# Patient Record
Sex: Female | Born: 1985 | Race: White | Hispanic: Yes | Marital: Single | Smoking: Never smoker
Health system: Southern US, Community
[De-identification: ages and names within clinical notes are randomized; demographics above are authoritative.]

## PROBLEM LIST (undated history)

## (undated) DIAGNOSIS — M5136 Other intervertebral disc degeneration, lumbar region: Secondary | ICD-10-CM

## (undated) DIAGNOSIS — M51369 Other intervertebral disc degeneration, lumbar region without mention of lumbar back pain or lower extremity pain: Secondary | ICD-10-CM

## (undated) DIAGNOSIS — G479 Sleep disorder, unspecified: Secondary | ICD-10-CM

## (undated) DIAGNOSIS — R635 Abnormal weight gain: Secondary | ICD-10-CM

## (undated) HISTORY — PX: COLONOSCOPY: SHX174

## (undated) HISTORY — DX: Other intervertebral disc degeneration, lumbar region: M51.36

## (undated) HISTORY — DX: Other intervertebral disc degeneration, lumbar region without mention of lumbar back pain or lower extremity pain: M51.369

## (undated) HISTORY — DX: Sleep disorder, unspecified: G47.9

## (undated) HISTORY — DX: Abnormal weight gain: R63.5

---

## 2017-12-17 ENCOUNTER — Ambulatory Visit (INDEPENDENT_AMBULATORY_CARE_PROVIDER_SITE_OTHER): Payer: 59 | Admitting: Physician Assistant

## 2017-12-17 ENCOUNTER — Encounter: Payer: Self-pay | Admitting: Physician Assistant

## 2017-12-17 VITALS — BP 113/89 | HR 110 | Ht 67.0 in | Wt 236.0 lb

## 2017-12-17 DIAGNOSIS — L299 Pruritus, unspecified: Secondary | ICD-10-CM

## 2017-12-17 DIAGNOSIS — Z13220 Encounter for screening for lipoid disorders: Secondary | ICD-10-CM | POA: Diagnosis not present

## 2017-12-17 DIAGNOSIS — Z6837 Body mass index (BMI) 37.0-37.9, adult: Secondary | ICD-10-CM | POA: Diagnosis not present

## 2017-12-17 DIAGNOSIS — E6609 Other obesity due to excess calories: Secondary | ICD-10-CM

## 2017-12-17 DIAGNOSIS — Z131 Encounter for screening for diabetes mellitus: Secondary | ICD-10-CM

## 2017-12-17 DIAGNOSIS — Z6839 Body mass index (BMI) 39.0-39.9, adult: Secondary | ICD-10-CM | POA: Insufficient documentation

## 2017-12-17 DIAGNOSIS — H01113 Allergic dermatitis of right eye, unspecified eyelid: Secondary | ICD-10-CM | POA: Diagnosis not present

## 2017-12-17 DIAGNOSIS — L219 Seborrheic dermatitis, unspecified: Secondary | ICD-10-CM | POA: Diagnosis not present

## 2017-12-17 DIAGNOSIS — E66812 Obesity, class 2: Secondary | ICD-10-CM | POA: Insufficient documentation

## 2017-12-17 MED ORDER — KETOCONAZOLE 2 % EX SHAM: 1.0000 "application " | MEDICATED_SHAMPOO | CUTANEOUS | 1 refills | Status: DC

## 2017-12-17 MED ORDER — PHENTERMINE HCL 37.5 MG PO TABS: 37.5000 mg | ORAL_TABLET | Freq: Every day | ORAL | 0 refills | Status: DC

## 2017-12-17 MED ORDER — AZELASTINE HCL 0.05 % OP SOLN: 1.0000 [drp] | Freq: Two times a day (BID) | OPHTHALMIC | 1 refills | Status: DC

## 2017-12-17 NOTE — Patient Instructions (Signed)
Seborrheic Dermatitis, Adult Seborrheic dermatitis is a skin disease that causes red, scaly patches. It usually occurs on the scalp, and it is often called dandruff. The patches may appear on other parts of the body. Skin patches tend to appear where there are many oil glands in the skin. Areas of the body that are commonly affected include:  Scalp.  Skin folds of the body.  Ears.  Eyebrows.  Neck.  Face.  Armpits.  The bearded area of men's faces.  The condition may come and go for no known reason, and it is often long-lasting (chronic). What are the causes? The cause of this condition is not known. What increases the risk? This condition is more likely to develop in people who:  Have certain conditions, such as: ? HIV (human immunodeficiency virus). ? AIDS (acquired immunodeficiency syndrome). ? Parkinson disease. ? Mood disorders, such as depression.  Are 40-60 years old.  What are the signs or symptoms? Symptoms of this condition include:  Thick scales on the scalp.  Redness on the face or in the armpits.  Skin that is flaky. The flakes may be white or yellow.  Skin that seems oily or dry but is not helped with moisturizers.  Itching or burning in the affected areas.  How is this diagnosed? This condition is diagnosed with a medical history and physical exam. A sample of your skin may be tested (skin biopsy). You may need to see a skin specialist (dermatologist). How is this treated? There is no cure for this condition, but treatment can help to manage the symptoms. You may get treatment to remove scales, lower the risk of skin infection, and reduce swelling or itching. Treatment may include:  Creams that reduce swelling and irritation (steroids).  Creams that reduce skin yeast.  Medicated shampoo, soaps, moisturizing creams, or ointments.  Medicated moisturizing creams or ointments.  Follow these instructions at home:  Apply over-the-counter and  prescription medicines only as told by your health care provider.  Use any medicated shampoo, soaps, skin creams, or ointments only as told by your health care provider.  Keep all follow-up visits as told by your health care provider. This is important. Contact a health care provider if:  Your symptoms do not improve with treatment.  Your symptoms get worse.  You have new symptoms. This information is not intended to replace advice given to you by your health care provider. Make sure you discuss any questions you have with your health care provider. Document Released: 01/16/2005 Document Revised: 08/06/2015 Document Reviewed: 05/06/2015 Elsevier Interactive Patient Education  2018 Elsevier Inc.  

## 2017-12-17 NOTE — Progress Notes (Signed)
New Patient Office Visit  Subjective:  Patient ID: CHE BELOW, female    DOB: Dec 31, 1985  Age: 32 y.o. MRN: [id number]  CC:  Chief Complaint  Patient presents with  . Hair/Scalp Problem    HPI Courtney Bailey presents to establish care and to discuss some issues.   She would like to work on weight loss. Over the years she has tried numerous diet plans. At one point being a vegetarian and working out 6 times a week she lost 100lbs. 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 She has never tried medication. She does not count her calories.   She does have some itchy and scaly scalp for last few months. She almost feels like all her skin is dry. She has tried coconut oil without any relief.   Her sister has hypothyroidism and is concerning for her.   She also complains of bumps under her right eyelid. She notices some inflammation and "catching on contact". She went to eye doctor and was given some prednisone drops. That did help but symptoms can right back. She does use monthly contacts.   History reviewed. No pertinent past medical history.  History reviewed. No pertinent surgical history.  Family History  Problem Relation Age of Onset  . Hypothyroidism Sister     Social History   Socioeconomic History  . Marital status: Single    Spouse name: Not on file  . Number of children: Not on file  . Years of education: Not on file  . Highest education level: Not on file  Occupational History  . Not on file  Social Needs  . Financial resource strain: Not on file  . Food insecurity:    Worry: Not on file    Inability: Not on file  . Transportation needs:    Medical: Not on file    Non-medical: Not on file  Tobacco Use  . Smoking status: Never Smoker  . Smokeless tobacco: Never Used  Substance and Sexual Activity  . Alcohol use: Not Currently  . Drug use: Never  . Sexual activity: Not Currently  Lifestyle  . Physical  activity:    Days per week: Not on file    Minutes per session: Not on file  . Stress: Not on file  Relationships  . Social connections:    Talks on phone: Not on file    Gets together: Not on file    Attends religious service: Not on file    Active member of club or organization: Not on file    Attends meetings of clubs or organizations: Not on file    Relationship status: Not on file  . Intimate partner violence:    Fear of current or ex partner: Not on file    Emotionally abused: Not on file    Physically abused: Not on file    Forced sexual activity: Not on file  Other Topics Concern  . Not on file  Social History Narrative  . Not on file    ROS Review of Systems  Objective:   Today's Vitals: BP 113/89   Pulse (!) 110   Ht 5\' 7"  (1.702 m)   Wt 236 lb (107 kg)   BMI 36.96 kg/m   Physical Exam  Constitutional: She is oriented to person, place, and time. She appears well-developed and well-nourished.  HENT:  Head: Normocephalic and atraumatic.  Scaly scalp around the hairline.   Eyes:  Conjunctivae are normal. Right eye exhibits no discharge. Left eye exhibits no discharge.  Under right eyelid very erythematous with small flesh colored papules.   Neck: Normal range of motion. Neck supple. No thyromegaly present.  Cardiovascular: Normal rate and regular rhythm.  Pulmonary/Chest: Effort normal and breath sounds normal.  Neurological: She is alert and oriented to person, place, and time.  Psychiatric: She has a normal mood and affect. Her behavior is normal.    Assessment & Plan:   Problem List Items Addressed This Visit      Unprioritized   Class 2 obesity due to excess calories without serious comorbidity with body mass index (BMI) of 37.0 to 37.9 in adult - Primary   Relevant Medications   phentermine (ADIPEX-P) 37.5 MG tablet   Other Relevant Orders   TSH   CBC with Differential/Platelet   Seborrheic dermatitis of scalp   Itchy scalp   Eyelid dermatitis,  allergic/contact, right    Other Visit Diagnoses    Screening for lipid disorders       Relevant Orders   Lipid Panel w/reflex Direct LDL   Screening for diabetes mellitus       Relevant Orders   COMPLETE METABOLIC PANEL WITH GFR      Outpatient Encounter Medications as of 12/17/2017  Medication Sig  . azelastine (OPTIVAR) 0.05 % ophthalmic solution Place 1 drop into both eyes 2 (two) times daily.  Marland Kitchen. ketoconazole (NIZORAL) 2 % shampoo Apply 1 application topically 2 (two) times a week.  . phentermine (ADIPEX-P) 37.5 MG tablet Take 1 tablet (37.5 mg total) by mouth daily before breakfast.   No facility-administered encounter medications on file as of 12/17/2017.    Marland Kitchen..Discussed low carb diet with 1500 calories and 80g of protein.  Exercising at least 150 minutes a week.  My Fitness Pal could be a [employer] resource.  Discussed medication options. Decided to start phentermine. Discussed side effects. Nurse visit 1 month.   Scalp appears like seb dermatitis. Given HO with OTC options. Ketoconazole sent to use twice weekly.   Will get some screening labs.   Discussed need for pap.   Follow-up: Return in about 4 weeks (around 01/14/2018) for nurse weight check.   Tandy GawJade Marilla Boddy, PA-C

## 2017-12-18 ENCOUNTER — Encounter: Payer: Self-pay | Admitting: Physician Assistant

## 2017-12-18 DIAGNOSIS — R7301 Impaired fasting glucose: Secondary | ICD-10-CM | POA: Insufficient documentation

## 2017-12-18 DIAGNOSIS — E785 Hyperlipidemia, unspecified: Secondary | ICD-10-CM | POA: Insufficient documentation

## 2017-12-18 NOTE — Progress Notes (Signed)
Call Pt: thyroid in normal range. Fasting glucose is a little elevated. Need to add A!C to further investigate glucose. Kidney, liver look great. No anemia.  LDL is elevated. HDL is low. Exercise and diet changes could help. No need for medication at this time(due to no current risk factors) but will need to monitor annually.

## 2017-12-21 ENCOUNTER — Encounter: Payer: Self-pay | Admitting: Physician Assistant

## 2017-12-21 DIAGNOSIS — R7303 Prediabetes: Secondary | ICD-10-CM | POA: Insufficient documentation

## 2017-12-21 LAB — COMPLETE METABOLIC PANEL WITH GFR
AG Ratio: 1.5 (calc) (ref 1.0–2.5)
ALBUMIN MSPROF: 4.1 g/dL (ref 3.6–5.1)
ALT: 13 U/L (ref 6–29)
AST: 13 U/L (ref 10–30)
Alkaline phosphatase (APISO): 63 U/L (ref 33–115)
BUN: 8 mg/dL (ref 7–25)
CO2: 26 mmol/L (ref 20–32)
CREATININE: 0.76 mg/dL (ref 0.50–1.10)
Calcium: 9.5 mg/dL (ref 8.6–10.2)
Chloride: 105 mmol/L (ref 98–110)
GFR, Est African American: 122 mL/min/{1.73_m2} (ref >=60)
GFR, Est Non African American: 105 mL/min/{1.73_m2} (ref >=60)
GLUCOSE: 113 mg/dL — AB (ref 65–99)
Globulin: 2.8 g/dL (calc) (ref 1.9–3.7)
Potassium: 4.2 mmol/L (ref 3.5–5.3)
Sodium: 138 mmol/L (ref 135–146)
TOTAL PROTEIN: 6.9 g/dL (ref 6.1–8.1)
Total Bilirubin: 0.2 mg/dL (ref 0.2–1.2)

## 2017-12-21 LAB — CBC WITH DIFFERENTIAL/PLATELET
BASOS ABS: 38 {cells}/uL (ref 0–200)
Basophils Relative: 0.7 %
EOS ABS: 211 {cells}/uL (ref 15–500)
Eosinophils Relative: 3.9 %
HEMATOCRIT: 37.7 % (ref 35.0–45.0)
Hemoglobin: 12.4 g/dL (ref 11.7–15.5)
LYMPHS ABS: 1760 {cells}/uL (ref 850–3900)
MCH: 28.5 pg (ref 27.0–33.0)
MCHC: 32.9 g/dL (ref 32.0–36.0)
MCV: 86.7 fL (ref 80.0–100.0)
MPV: 10.6 fL (ref 7.5–12.5)
Monocytes Relative: 7.2 %
NEUTROS PCT: 55.6 %
Neutro Abs: 3002 cells/uL (ref 1500–7800)
Platelets: 318 10*3/uL (ref 140–400)
RBC: 4.35 10*6/uL (ref 3.80–5.10)
RDW: 13.4 % (ref 11.0–15.0)
Total Lymphocyte: 32.6 %
WBC: 5.4 10*3/uL (ref 3.8–10.8)
WBCMIX: 389 {cells}/uL (ref 200–950)

## 2017-12-21 LAB — LIPID PANEL W/REFLEX DIRECT LDL
CHOLESTEROL: 216 mg/dL — AB (ref <200)
HDL: 48 mg/dL — ABNORMAL LOW (ref >50)
LDL Cholesterol (Calc): 148 mg/dL (calc) — ABNORMAL HIGH
Non-HDL Cholesterol (Calc): 168 mg/dL (calc) — ABNORMAL HIGH (ref <130)
TRIGLYCERIDES: 93 mg/dL (ref <150)
Total CHOL/HDL Ratio: 4.5 (calc) (ref <5.0)

## 2017-12-21 LAB — HEMOGLOBIN A1C W/OUT EAG: HEMOGLOBIN A1C: 5.7 %{Hb} — AB (ref <5.7)

## 2017-12-21 LAB — TSH: TSH: 2.47 m[IU]/L

## 2017-12-21 LAB — TEST AUTHORIZATION

## 2017-12-21 NOTE — Progress Notes (Signed)
Call pt: you are in pre-diabetes. Start now watching sugar/carbs/weight. Will continue to recheck every 6 month.

## 2017-12-31 ENCOUNTER — Encounter: Payer: Self-pay | Admitting: Physician Assistant

## 2018-01-04 ENCOUNTER — Ambulatory Visit (INDEPENDENT_AMBULATORY_CARE_PROVIDER_SITE_OTHER): Payer: 59 | Admitting: Physician Assistant

## 2018-01-04 ENCOUNTER — Encounter: Payer: Self-pay | Admitting: Physician Assistant

## 2018-01-04 VITALS — BP 119/83 | HR 104 | Ht 67.0 in | Wt 234.0 lb

## 2018-01-04 DIAGNOSIS — H9190 Unspecified hearing loss, unspecified ear: Secondary | ICD-10-CM | POA: Insufficient documentation

## 2018-01-04 DIAGNOSIS — H6983 Other specified disorders of Eustachian tube, bilateral: Secondary | ICD-10-CM | POA: Diagnosis not present

## 2018-01-04 MED ORDER — METHYLPREDNISOLONE 4 MG PO TBPK: ORAL_TABLET | ORAL | 0 refills | Status: DC

## 2018-01-04 NOTE — Progress Notes (Signed)
   Subjective:    Patient ID: Courtney Bailey, female    DOB: 01/27/1986, 32 y.o.   MRN: [id number]  HPI Patient is a 32 year old female presenting to clinic with a complaint of muffled hearing. She thought that she might have a cerumen impaction and wanted to be seen and have ears irrigated if necessary. Right ear is worse than left. She denies pain, pressure, popping, sinus congestion, fever, and malaise.   .. Active Ambulatory Problems    Diagnosis Date Noted  . Class 2 obesity due to excess calories without serious comorbidity with body mass index (BMI) of 37.0 to 37.9 in adult 12/17/2017  . Seborrheic dermatitis of scalp 12/17/2017  . Itchy scalp 12/17/2017  . Eyelid dermatitis, allergic/contact, right 12/17/2017  . Elevated fasting glucose 12/18/2017  . Dyslipidemia (high LDL; low HDL) 12/18/2017  . Prediabetes 12/21/2017  . Perceived hearing changes 01/04/2018  . ETD (Eustachian tube dysfunction), bilateral 01/04/2018   Resolved Ambulatory Problems    Diagnosis Date Noted  . No Resolved Ambulatory Problems   No Additional Past Medical History      Review of Systems  Constitutional: Negative for chills, fatigue and fever.  HENT: Positive for hearing loss. Negative for congestion, ear discharge, ear pain, postnasal drip, rhinorrhea, sinus pressure, sinus pain, sneezing and sore throat.   Respiratory: Negative for cough, shortness of breath and wheezing.        Objective:   Physical Exam  Constitutional: She appears well-developed and well-nourished. No distress.  HENT:  Right Ear: External ear and ear canal normal. A middle ear effusion is present.  Left Ear: External ear and ear canal normal. A middle ear effusion is present.  Mouth/Throat: Oropharynx is clear and moist.  No cerumen noted bilaterally. Effusion present bilaterally, but is worse in right ear.          Assessment & Plan:  .Marland Kitchen.Courtney Bailey was seen today for hearing loss.  Diagnoses and all orders for  this visit:  ETD (Eustachian tube dysfunction), bilateral -     methylPREDNISolone (MEDROL DOSEPAK) 4 MG TBPK tablet; Take as directed by package insert.  Perceived hearing changes    Muffled hearing: No cerumen noted bilaterally. No infection.  Effusion present in both ears. Assured patient that muffled hearing most likely due to effusion. Encouraged her to use Flonase to decrease congestion. She was instructed to use this first and if no relief then can progress to prescribed Medrol.  Marland Kitchen.Harlon Flor.I, Foxx Klarich PA-C, have reviewed and agree with the above documentation in it's entirety.

## 2018-01-04 NOTE — Patient Instructions (Signed)
Eustachian Tube Dysfunction The eustachian tube connects the middle ear to the back of the nose. It regulates air pressure in the middle ear by allowing air to move between the ear and nose. It also helps to drain fluid from the middle ear space. When the eustachian tube does not function properly, air pressure, fluid, or both can build up in the middle ear. Eustachian tube dysfunction can affect one or both ears. What are the causes? This condition happens when the eustachian tube becomes blocked or cannot open normally. This may result from:  Ear infections.  Colds and other upper respiratory infections.  Allergies.  Irritation, such as from cigarette smoke or acid from the stomach coming up into the esophagus (gastroesophageal reflux).  Sudden changes in air pressure, such as from descending in an airplane.  Abnormal growths in the nose or throat, such as nasal polyps, tumors, or enlarged tissue at the back of the throat (adenoids).  What increases the risk? This condition may be more likely to develop in people who smoke and people who are overweight. Eustachian tube dysfunction may also be more likely to develop in children, especially children who have:  Certain birth defects of the mouth, such as cleft palate.  Large tonsils and adenoids.  What are the signs or symptoms? Symptoms of this condition may include:  A feeling of fullness in the ear.  Ear pain.  Clicking or popping noises in the ear.  Ringing in the ear.  Hearing loss.  Loss of balance.  Symptoms may get worse when the air pressure around you changes, such as when you travel to an area of high elevation or fly on an airplane. How is this diagnosed? This condition may be diagnosed based on:  Your symptoms.  A physical exam of your ear, nose, and throat.  Tests, such as those that measure: ? The movement of your eardrum (tympanogram). ? Your hearing (audiometry).  How is this treated? Treatment  depends on the cause and severity of your condition. If your symptoms are mild, you may be able to relieve your symptoms by moving air into ("popping") your ears. If you have symptoms of fluid in your ears, treatment may include:  Decongestants.  Antihistamines.  Nasal sprays or ear drops that contain medicines that reduce swelling (steroids).  In some cases, you may need to have a procedure to drain the fluid in your eardrum (myringotomy). In this procedure, a small tube is placed in the eardrum to:  Drain the fluid.  Restore the air in the middle ear space.  Follow these instructions at home:  Take over-the-counter and prescription medicines only as told by your health care provider.  Use techniques to help pop your ears as recommended by your health care provider. These may include: ? Chewing gum. ? Yawning. ? Frequent, forceful swallowing. ? Closing your mouth, holding your nose closed, and gently blowing as if you are trying to blow air out of your nose.  Do not do any of the following until your health care provider approves: ? Travel to high altitudes. ? Fly in airplanes. ? Work in a pressurized cabin or room. ? Scuba dive.  Keep your ears dry. Dry your ears completely after showering or bathing.  Do not smoke.  Keep all follow-up visits as told by your health care provider. This is important. Contact a health care provider if:  Your symptoms do not go away after treatment.  Your symptoms come back after treatment.  You are   unable to pop your ears.  You have: ? A fever. ? Pain in your ear. ? Pain in your head or neck. ? Fluid draining from your ear.  Your hearing suddenly changes.  You become very dizzy.  You lose your balance. This information is not intended to replace advice given to you by your health care provider. Make sure you discuss any questions you have with your health care provider. Document Released: 02/12/2015 Document Revised: 06/24/2015  Document Reviewed: 02/04/2014 Elsevier Interactive Patient Education  2018 Elsevier Inc.  

## 2018-01-31 ENCOUNTER — Encounter: Payer: 59 | Admitting: Obstetrics & Gynecology

## 2018-02-13 ENCOUNTER — Encounter: Payer: Self-pay | Admitting: Physician Assistant

## 2018-02-13 ENCOUNTER — Ambulatory Visit (INDEPENDENT_AMBULATORY_CARE_PROVIDER_SITE_OTHER): Payer: 59 | Admitting: Physician Assistant

## 2018-02-13 VITALS — BP 132/85 | HR 84 | Ht 67.0 in | Wt 240.0 lb

## 2018-02-13 DIAGNOSIS — F339 Major depressive disorder, recurrent, unspecified: Secondary | ICD-10-CM

## 2018-02-13 DIAGNOSIS — F419 Anxiety disorder, unspecified: Secondary | ICD-10-CM

## 2018-02-13 DIAGNOSIS — F5101 Primary insomnia: Secondary | ICD-10-CM

## 2018-02-13 MED ORDER — ZOLPIDEM TARTRATE 5 MG PO TABS: 5.0000 mg | ORAL_TABLET | Freq: Every evening | ORAL | 5 refills | Status: DC | PRN

## 2018-02-13 NOTE — Progress Notes (Signed)
Subjective:    Patient ID: Courtney Bailey, female    DOB: [birth year], 33 y.o.   MRN: [id number]  HPI  Pt is a 33 yo female who presents to the clinic to follow up on weight loss and phentermine.   She did not like phentermine. She has a hx of anxiety, depression and insomnia and it made it worse. She has stopped it.   She feels like her sleep is getting worse and worse. She has not been able to sleep well since early 20's. She has tried OTC meds and trazodone caused nightmares. She once tried [place] and it worked but never had one again. She goes to bed around 11 but wakes up 2 and then up and down.   She does feel like her mood is worse too but unsure if it is because of not sleeping. She is more tearful and anxious. No SI/HC.  Marland Kitchen. Active Ambulatory Problems    Diagnosis Date Noted  . Class 2 obesity due to excess calories without serious comorbidity with body mass index (BMI) of 37.0 to 37.9 in adult 12/17/2017  . Seborrheic dermatitis of scalp 12/17/2017  . Itchy scalp 12/17/2017  . Eyelid dermatitis, allergic/contact, right 12/17/2017  . Elevated fasting glucose 12/18/2017  . Dyslipidemia (high LDL; low HDL) 12/18/2017  . Prediabetes 12/21/2017  . Perceived hearing changes 01/04/2018  . ETD (Eustachian tube dysfunction), bilateral 01/04/2018  . Depression, recurrent (HCC) 02/19/2018  . Anxiety 02/19/2018  . Primary insomnia 02/19/2018   Resolved Ambulatory Problems    Diagnosis Date Noted  . No Resolved Ambulatory Problems   No Additional Past Medical History    Review of Systems  All other systems reviewed and are negative.      Objective:   Physical Exam Vitals signs reviewed.  Constitutional:      Appearance: Normal appearance. She is obese.  HENT:     Head: Normocephalic and atraumatic.  Cardiovascular:     Rate and Rhythm: Normal rate and regular rhythm.     Pulses: Normal pulses.     Heart sounds: Normal heart sounds.  Neurological:     General: No  focal deficit present.     Mental Status: She is alert and oriented to person, place, and time.  Psychiatric:     Comments: Tearful.            Assessment & Plan:  .Marland KitchenCally was seen today for medication management.  Diagnoses and all orders for this visit:  Primary insomnia -     zolpidem (AMBIEN) 5 MG tablet; Take 1 tablet (5 mg total) by mouth at bedtime as needed for sleep.  Anxiety  Depression, recurrent (HCC)   .Marland Kitchen Depression screen PHQ 2/9 12/17/2017  Decreased Interest 0  Down, Depressed, Hopeless 0  PHQ - 2 Score 0  Altered sleeping 1  Tired, decreased energy 0  Change in appetite 0  Feeling bad or failure about yourself  0  Trouble concentrating 1  Moving slowly or fidgety/restless 0  Suicidal thoughts 0  PHQ-9 Score 2  Difficult doing work/chores Not difficult at all   .Marland Kitchen GAD 7 : Generalized Anxiety Score 12/17/2017  Nervous, Anxious, on Edge 1  Control/stop worrying 0  Worry too much - different things 0  Trouble relaxing 1  Restless 0  Easily annoyed or irritable 0  Afraid - awful might happen 0  Total GAD 7 Score 2  Anxiety Difficulty Not difficult at all   MDQ- 1 question away from  positive bipolar screen.   Back in November anxiety and depression screening scores were pretty good. I am wondering if adding phentermine causes some temporary worsening. She has stopped now. I would like to ger her sleeping first and see how she is doing before we add any more medications. ambien sent. She has tolerate well in the past. Follow up in 71month.

## 2018-02-19 DIAGNOSIS — F419 Anxiety disorder, unspecified: Secondary | ICD-10-CM | POA: Insufficient documentation

## 2018-02-19 DIAGNOSIS — F5101 Primary insomnia: Secondary | ICD-10-CM | POA: Insufficient documentation

## 2018-02-19 DIAGNOSIS — F339 Major depressive disorder, recurrent, unspecified: Secondary | ICD-10-CM | POA: Insufficient documentation

## 2018-02-19 DIAGNOSIS — G4700 Insomnia, unspecified: Secondary | ICD-10-CM | POA: Insufficient documentation

## 2018-03-06 ENCOUNTER — Other Ambulatory Visit: Payer: Self-pay

## 2018-03-06 MED ORDER — KETOCONAZOLE 2 % EX SHAM: 1.0000 "application " | MEDICATED_SHAMPOO | CUTANEOUS | 1 refills | Status: DC

## 2018-03-18 ENCOUNTER — Ambulatory Visit (INDEPENDENT_AMBULATORY_CARE_PROVIDER_SITE_OTHER): Payer: 59 | Admitting: Obstetrics & Gynecology

## 2018-03-18 ENCOUNTER — Encounter: Payer: Self-pay | Admitting: Obstetrics & Gynecology

## 2018-03-18 VITALS — BP 124/88 | HR 76 | Ht 67.0 in | Wt 250.0 lb

## 2018-03-18 DIAGNOSIS — N939 Abnormal uterine and vaginal bleeding, unspecified: Secondary | ICD-10-CM

## 2018-03-18 DIAGNOSIS — N926 Irregular menstruation, unspecified: Secondary | ICD-10-CM

## 2018-03-18 DIAGNOSIS — Z3169 Encounter for other general counseling and advice on procreation: Secondary | ICD-10-CM | POA: Diagnosis not present

## 2018-03-18 NOTE — Progress Notes (Signed)
Pt wants to discuss becoming pregnant and irregular periods

## 2018-03-18 NOTE — Progress Notes (Signed)
   Subjective:    Patient ID: Courtney Bailey, female    DOB: 01-29-1986, 33 y.o.   MRN: [id number]  HPI Patient is a 33 year old nulliparous female who presents for evaluation of menstrual cycle.  Her menstrual cycle never came twice in a month.  Now her cycles are every 30 days in the past 2 months she has had 2 cycles.  See below for details.  Menarche age 32 Regular from the start, never have been on birth control December--5 days (during the first week) December 31st bled for 7 days January 30-Feb 3--nml menses  Patient would like to become pregnant.  She is not taking prenatal vitamins but will start.  She is not had cystic fibrosis or SMA testing.  This was offered today.  Patient also asked about optimal weight before conceiving.  We discussed the risk of hypertension diabetes preeclampsia C-section with morbid obesity (BMI = 39) in pregnancy.  Patient is already prediabetic and would most likely flip into gestational diabetes.  Review of Systems  Constitutional: Negative.   Respiratory: Negative.   Cardiovascular: Negative.   Gastrointestinal: Negative.   Genitourinary: Negative.        Objective:   Physical Exam Vitals signs reviewed.  Constitutional:      General: She is not in acute distress.    Appearance: She is well-developed.  HENT:     Head: Normocephalic and atraumatic.  Eyes:     Conjunctiva/sclera: Conjunctivae normal.  Cardiovascular:     Rate and Rhythm: Normal rate.  Pulmonary:     Effort: Pulmonary effort is normal.  Skin:    General: Skin is warm and dry.  Neurological:     Mental Status: She is alert and oriented to person, place, and time.    Assessment & Plan:  33 year old female with change in menstrual cycle but still within normal limits.  Cycles are 30 days.  1.  Prenatal vitamins 2.  CF F and SMA testing suggested 3.  Starting pregnancy at normal weight encouraged.  Discussed weight watchers and my fitness pal as well as exercise. 4.   Patient needs a Pap smear.  It is probably been 3 years.  She will schedule an annual exam.  30 minutes spent face-to-face with patient with greater than 50% counseling.

## 2018-03-27 ENCOUNTER — Other Ambulatory Visit: Payer: Self-pay | Admitting: Physician Assistant

## 2018-03-27 DIAGNOSIS — H01113 Allergic dermatitis of right eye, unspecified eyelid: Secondary | ICD-10-CM

## 2018-03-27 NOTE — Progress Notes (Signed)
Pt continues to have problem with her right dry, itchy, eye. Made referral to eye doctor.

## 2018-04-09 DIAGNOSIS — H10413 Chronic giant papillary conjunctivitis, bilateral: Secondary | ICD-10-CM | POA: Diagnosis not present

## 2018-04-22 ENCOUNTER — Ambulatory Visit: Payer: 59 | Admitting: Obstetrics & Gynecology

## 2018-04-30 ENCOUNTER — Encounter

## 2018-04-30 ENCOUNTER — Other Ambulatory Visit: Payer: Self-pay

## 2018-04-30 ENCOUNTER — Ambulatory Visit (INDEPENDENT_AMBULATORY_CARE_PROVIDER_SITE_OTHER): Payer: 59 | Admitting: Physician Assistant

## 2018-04-30 ENCOUNTER — Encounter: Payer: Self-pay | Admitting: Physician Assistant

## 2018-04-30 VITALS — BP 138/81 | HR 72 | Temp 97.7°F | Wt 247.0 lb

## 2018-04-30 DIAGNOSIS — J302 Other seasonal allergic rhinitis: Secondary | ICD-10-CM

## 2018-04-30 DIAGNOSIS — H1013 Acute atopic conjunctivitis, bilateral: Secondary | ICD-10-CM | POA: Diagnosis not present

## 2018-04-30 DIAGNOSIS — R52 Pain, unspecified: Secondary | ICD-10-CM

## 2018-04-30 DIAGNOSIS — J301 Allergic rhinitis due to pollen: Secondary | ICD-10-CM | POA: Diagnosis not present

## 2018-04-30 NOTE — Progress Notes (Signed)
Subjective:    Patient ID: Courtney Bailey, female    DOB: 1986/01/19, 33 y.o.   MRN: [id number]  HPI Pt is a 33 yo female who presents to the clinic to get cleared to go back to work. She came into work yesterday and reported body aches, sneezing, coughing, and feeling hot on her morning assessment due to COVID pandemic. She was sent home from work. Her temperature was 98.1 yesterday and this morning 97.7. She has hx of allergies and suspects allergies that were causing symptoms. She also started her period in which her body aches stopped. She is taking zyrtec. She denies any SOB. No travel. No sick contacts. She has no high risk exposure at work. She is ready to go back to work.     .. Active Ambulatory Problems    Diagnosis Date Noted  . Class 2 obesity due to excess calories without serious comorbidity with body mass index (BMI) of 37.0 to 37.9 in adult 12/17/2017  . Seborrheic dermatitis of scalp 12/17/2017  . Itchy scalp 12/17/2017  . Eyelid dermatitis, allergic/contact, right 12/17/2017  . Elevated fasting glucose 12/18/2017  . Dyslipidemia (high LDL; low HDL) 12/18/2017  . Prediabetes 12/21/2017  . Perceived hearing changes 01/04/2018  . ETD (Eustachian tube dysfunction), bilateral 01/04/2018  . Depression, recurrent (HCC) 02/19/2018  . Anxiety 02/19/2018  . Primary insomnia 02/19/2018  . Seasonal allergies 04/30/2018  . Seasonal allergic rhinitis due to pollen 04/30/2018  . Allergic conjunctivitis of both eyes 04/30/2018   Resolved Ambulatory Problems    Diagnosis Date Noted  . No Resolved Ambulatory Problems   Past Medical History:  Diagnosis Date  . Disc degeneration, lumbar   . Sleep difficulties   . Weight gain       Review of Systems   see HPI.  Objective:   Physical Exam Vitals signs reviewed.  Constitutional:      Appearance: Normal appearance.  HENT:     Head: Normocephalic.     Right Ear: Tympanic membrane and ear canal normal.     Left Ear:  Tympanic membrane and ear canal normal.     Nose: Rhinorrhea present. No congestion.     Comments: Clear nasal drainage.     Mouth/Throat:     Pharynx: Oropharynx is clear. No oropharyngeal exudate or posterior oropharyngeal erythema.  Eyes:     General:        Right eye: No discharge.        Left eye: No discharge.     Conjunctiva/sclera: Conjunctivae normal.  Neck:     Musculoskeletal: Normal range of motion.  Cardiovascular:     Rate and Rhythm: Normal rate and regular rhythm.     Pulses: Normal pulses.  Pulmonary:     Effort: Pulmonary effort is normal.     Breath sounds: Normal breath sounds. No wheezing or rhonchi.  Lymphadenopathy:     Cervical: No cervical adenopathy.  Skin:    General: Skin is warm.  Neurological:     General: No focal deficit present.     Mental Status: She is alert and oriented to person, place, and time.  Psychiatric:        Mood and Affect: Mood normal.        Behavior: Behavior normal.           Assessment & Plan:  .Marland KitchenKaysey was seen today for dysmenorrhea.  Diagnoses and all orders for this visit:  Seasonal allergic rhinitis due to pollen  Allergic conjunctivitis  of both eyes  Seasonal allergies  Body aches   Pt does not look ill appearing or toxic today. She has hx of allergies and admits to running outside on Sunday which typically causes her allergies to be worse. She is on zyrtec. She also started her period yesterday in which body aches stopped. She has no fever or SOB. Her cough is more of a throat clearing. Vitals look great today. She has no high risk patient exposure in office. I believe she should be release to go back to work immediately.   She needs to have her final clearance by nurse with [facility] team due to the flagged risk on morning assessment. She is aware.

## 2018-05-14 ENCOUNTER — Encounter: Payer: Self-pay | Admitting: Physician Assistant

## 2018-05-14 ENCOUNTER — Telehealth (INDEPENDENT_AMBULATORY_CARE_PROVIDER_SITE_OTHER): Payer: 59 | Admitting: Physician Assistant

## 2018-05-14 VITALS — BP 125/73 | HR 79 | Temp 97.5°F | Ht 67.0 in | Wt 240.0 lb

## 2018-05-14 DIAGNOSIS — N923 Ovulation bleeding: Secondary | ICD-10-CM | POA: Insufficient documentation

## 2018-05-14 NOTE — Progress Notes (Signed)
Patient ID: Courtney Bailey, female   DOB: [birth year], 33 y.o.   MRN: [id number]  .Marland KitchenVirtual Visit via Video Note  I connected with Courtney Bailey on 05/14/18 at  2:00 PM EDT by a video enabled telemedicine application and verified that I am speaking with the correct person using two identifiers.   I discussed the limitations of evaluation and management by telemedicine and the availability of in person appointments. The patient expressed understanding and agreed to proceed.  History of Present Illness: Pt is a 33 yo female who c/o spotting this morning. She is not on birth control. She denies any urinary symptoms, abdominal pain and hx of spotting. She is under a lot of stress. Her aunt lives in [address] with COVID pandemic going on and has a brain tumor newly dx. She wiped and noticed 2 clots of blood. She had sex a few days ago but not rough. Denies any vaginal discharge, itching or irritation.   .. Active Ambulatory Problems    Diagnosis Date Noted  . Class 2 obesity due to excess calories without serious comorbidity with body mass index (BMI) of 37.0 to 37.9 in adult 12/17/2017  . Seborrheic dermatitis of scalp 12/17/2017  . Itchy scalp 12/17/2017  . Eyelid dermatitis, allergic/contact, right 12/17/2017  . Elevated fasting glucose 12/18/2017  . Dyslipidemia (high LDL; low HDL) 12/18/2017  . Prediabetes 12/21/2017  . Perceived hearing changes 01/04/2018  . ETD (Eustachian tube dysfunction), bilateral 01/04/2018  . Depression, recurrent (HCC) 02/19/2018  . Anxiety 02/19/2018  . Primary insomnia 02/19/2018  . Seasonal allergies 04/30/2018  . Seasonal allergic rhinitis due to pollen 04/30/2018  . Allergic conjunctivitis of both eyes 04/30/2018   Resolved Ambulatory Problems    Diagnosis Date Noted  . No Resolved Ambulatory Problems   Past Medical History:  Diagnosis Date  . Disc degeneration, lumbar   . Sleep difficulties   . Weight gain    Reviewed med, allergy and problem  list.    Observations/Objective: No acute stress.  Normal heart rate.   .. Today's Vitals   05/14/18 1348  BP: 125/73  Pulse: 79  Temp: (!) 97.5 F (36.4 C)  TempSrc: Oral  SpO2: 99%  Weight: 240 lb (108.9 kg)  Height: 5\' 7"  (1.702 m)   Body mass index is 37.59 kg/m.   Assessment and Plan: Marland KitchenMarland KitchenDiagnoses and all orders for this visit:  Spotting between menses   UPT in office negative.   Discussed causes of menstrual spotting abrasion vs stress vs thyroid vs pregnancy. Since first day and just a few spots will continue to monitor. If her period does not come in 2 weeks repeat UPT. She is not preventing pregnancy but not trying either. Reassurance and close monitoring. If continues to have bleeding will do a pelvic exam as well.   Follow Up Instructions:    I discussed the assessment and treatment plan with the patient. The patient was provided an opportunity to ask questions and all were answered. The patient agreed with the plan and demonstrated an understanding of the instructions.   The patient was advised to call back or seek an in-person evaluation if the symptoms worsen or if the condition fails to improve as anticipated.  I provided 10 minutes of non-face-to-face time during this encounter.   Tandy Gaw, PA-C

## 2018-05-31 ENCOUNTER — Other Ambulatory Visit: Payer: Self-pay

## 2018-05-31 MED ORDER — KETOCONAZOLE 2 % EX SHAM: 1.0000 "application " | MEDICATED_SHAMPOO | CUTANEOUS | 1 refills | Status: DC

## 2018-07-15 ENCOUNTER — Encounter: Payer: Self-pay | Admitting: Physician Assistant

## 2018-07-15 ENCOUNTER — Telehealth (INDEPENDENT_AMBULATORY_CARE_PROVIDER_SITE_OTHER): Payer: 59 | Admitting: Physician Assistant

## 2018-07-15 VITALS — Temp 98.4°F | Ht 67.0 in | Wt 249.3 lb

## 2018-07-15 DIAGNOSIS — E6609 Other obesity due to excess calories: Secondary | ICD-10-CM

## 2018-07-15 DIAGNOSIS — Z6839 Body mass index (BMI) 39.0-39.9, adult: Secondary | ICD-10-CM | POA: Diagnosis not present

## 2018-07-15 MED ORDER — TOPIRAMATE 50 MG PO TABS: ORAL_TABLET | ORAL | 2 refills | Status: DC

## 2018-07-15 MED ORDER — PHENTERMINE HCL 37.5 MG PO TBDP: 1.0000 | ORAL_TABLET | Freq: Every day | ORAL | 0 refills | Status: DC

## 2018-07-15 NOTE — Progress Notes (Deleted)
She wants to discuss weight loss medications. She had palpitations with phentermine in past, but wondering if she can go back to half dose or try alternative medication.

## 2018-07-15 NOTE — Progress Notes (Signed)
Patient ID: Courtney Bailey, female   DOB: 1985/11/22, 33 y.o.   MRN: [id number] .Marland KitchenVirtual Visit via Video Note  I connected with Courtney Bailey on 07/15/18 at  9:50 AM EDT by a video enabled telemedicine application and verified that I am speaking with the correct person using two identifiers.  Location: Patient: home Provider: clinic   I discussed the limitations of evaluation and management by telemedicine and the availability of in person appointments. The patient expressed understanding and agreed to proceed.  History of Present Illness: Pt is a 33 yo obese female who calls into the clinic to discuss weight gain. She continues to gain weight. She would like to start trying for pregnancy in the near future but would like to get to a more healthy weight. She tried phentermine for a week once before but stopped due to palpitations and insomnia. She would like to try again at a lower dose. She is trying to watch calories. She is not currently exercising.   .. Active Ambulatory Problems    Diagnosis Date Noted  . Class 2 obesity due to excess calories without serious comorbidity with body mass index (BMI) of 39.0 to 39.9 in adult 12/17/2017  . Seborrheic dermatitis of scalp 12/17/2017  . Itchy scalp 12/17/2017  . Eyelid dermatitis, allergic/contact, right 12/17/2017  . Elevated fasting glucose 12/18/2017  . Dyslipidemia (high LDL; low HDL) 12/18/2017  . Prediabetes 12/21/2017  . Perceived hearing changes 01/04/2018  . ETD (Eustachian tube dysfunction), bilateral 01/04/2018  . Depression, recurrent ([place]) 02/19/2018  . Anxiety 02/19/2018  . Primary insomnia 02/19/2018  . Seasonal allergies 04/30/2018  . Seasonal allergic rhinitis due to pollen 04/30/2018  . Allergic conjunctivitis of both eyes 04/30/2018  . Spotting between menses 05/14/2018   Resolved Ambulatory Problems    Diagnosis Date Noted  . No Resolved Ambulatory Problems   Past Medical History:  Diagnosis Date  . Disc  degeneration, lumbar   . Sleep difficulties   . Weight gain    Reviewed med, allergy, problem list.     Observations/Objective: No acute distress. Normal appearance.  Normal breathing.   .. Today's Vitals   07/15/18 0818  Temp: 98.4 F (36.9 C)  TempSrc: Oral  Weight: 249 lb 5 oz (113.1 kg)  Height: 5\' 7"  (1.702 m)   Body mass index is 39.05 kg/m.   Assessment and Plan: .Marland KitchenDori was seen today for obesity.  Diagnoses and all orders for this visit:  Class 2 obesity due to excess calories without serious comorbidity with body mass index (BMI) of 39.0 to 39.9 in adult -     topiramate (TOPAMAX) 50 MG tablet; One half tab by mouth daily for a week, then one tab by mouth daily then increase to twice a day. -     Phentermine HCl 37.5 MG TBDP; Take 1 tablet by mouth daily.   Marland Kitchen.Discussed low carb diet with 1500 calories and 80g of protein.  Exercising at least 150 minutes a week.  My Fitness Pal could be a Microbiologist.  Consider weight watchers.  Sent phentermine to take 1/2 tablet in the morning. Discussed if any worsening insomnia or palpitations or mood changes to stop.  Add topamax discussed side effects.   Follow up in 1 month.   Follow Up Instructions:    I discussed the assessment and treatment plan with the patient. The patient was provided an opportunity to ask questions and all were answered. The patient agreed with the plan and demonstrated an  understanding of the instructions.   The patient was advised to call back or seek an in-person evaluation if the symptoms worsen or if the condition fails to improve as anticipated.    Tandy Gaw, PA-C

## 2018-07-17 DIAGNOSIS — H52223 Regular astigmatism, bilateral: Secondary | ICD-10-CM | POA: Diagnosis not present

## 2018-07-18 ENCOUNTER — Ambulatory Visit (INDEPENDENT_AMBULATORY_CARE_PROVIDER_SITE_OTHER): Payer: 59 | Admitting: Osteopathic Medicine

## 2018-07-18 DIAGNOSIS — M9901 Segmental and somatic dysfunction of cervical region: Secondary | ICD-10-CM

## 2018-07-18 MED ORDER — CYCLOBENZAPRINE HCL 10 MG PO TABS: 5.0000 mg | ORAL_TABLET | Freq: Three times a day (TID) | ORAL | 1 refills | Status: DC | PRN

## 2018-07-18 NOTE — Progress Notes (Signed)
HPI: Courtney Bailey is a 33 y.o. female who  has a past medical history of Disc degeneration, lumbar, Sleep difficulties, and Weight gain.  she presents to [facility] today, 07/18/18,  for chief complaint of:  Neck pain  . Context: no injury, seems like she might have slept wrong  . Location: L side of neck and toward shoulder area  . Quality: sore, tight . Duration: <1 day    At today's visit 07/18/18 ... PMH, PSH, FH reviewed and updated as needed.  Current medication list and allergy/intolerance hx reviewed and updated as needed. (See remainder of HPI, ROS, Phys Exam below)          ASSESSMENT/PLAN: The encounter diagnosis was Somatic dysfunction of spine, cervical.   OMT applied to (+)patient relief: MFR, FPR to tenderpoint in cervical spine and trapezius muscle on L   No orders of the defined types were placed in this encounter.    No orders of the defined types were placed in this encounter.   There are no Patient Instructions on file for this visit.    Follow-up plan: Return if symptoms worsen or fail to improve.                                                 ################################################# ################################################# ################################################# #################################################    No outpatient medications have been marked as taking for the 07/18/18 encounter (Appointment) with Emeterio Reeve, DO.    Allergies  Allergen Reactions  . Penicillin G Other (See Comments)    Does not know reaction. Occurred in early childhood Does not know reaction. Occurred in early childhood   . Trazodone Other (See Comments)    Nightmares  . Escitalopram Oxalate Anxiety    Worsening anxiety symptoms.       Review of Systems:  Constitutional: No recent illness  HEENT: No  headache, no vision  change  Musculoskeletal: +new myalgia/arthralgia   Exam:  There were no vitals taken for this visit.  Constitutional: VS see above. General Appearance: alert, well-developed, well-nourished, NAD  Neck: No masses, trachea midline.   Respiratory: Normal respiratory effort.   Musculoskeletal: Gait normal. Symmetric and independent movement of all extremities. Tender point trapezius area at base of neck on the L. Restricted cervical spine ROM w/ ropy texture to paraspinals on the L   Neurological: Normal balance/coordination. No tremor.  Skin: warm, dry, intact.   Psychiatric: Normal judgment/insight. Normal mood and affect. Oriented x3.       Visit summary with medication list and pertinent instructions was printed for patient to review, patient was advised to alert [place] if any updates are needed. All questions at time of visit were answered - patient instructed to contact office with any additional concerns. ER/RTC precautions were reviewed with the patient and understanding verbalized.   Note: Total time spent 15 minutes, greater than 50% of the visit was spent face-to-face counseling and coordinating care for the following: The encounter diagnosis was Somatic dysfunction of spine, cervical..  Please note: voice recognition software was used to produce this document, and typos may escape review. Please contact Dr. Sheppard Coil for any needed clarifications.    Follow up plan: No follow-ups on file.

## 2018-07-18 NOTE — Addendum Note (Signed)
Addended by: Maryla Morrow on: 07/18/2018 01:07 PM   Modules accepted: Orders

## 2018-08-14 ENCOUNTER — Ambulatory Visit (INDEPENDENT_AMBULATORY_CARE_PROVIDER_SITE_OTHER): Payer: 59 | Admitting: Physician Assistant

## 2018-08-14 ENCOUNTER — Encounter: Payer: Self-pay | Admitting: Physician Assistant

## 2018-08-14 ENCOUNTER — Other Ambulatory Visit: Payer: Self-pay

## 2018-08-14 DIAGNOSIS — E6609 Other obesity due to excess calories: Secondary | ICD-10-CM | POA: Diagnosis not present

## 2018-08-14 DIAGNOSIS — Z6836 Body mass index (BMI) 36.0-36.9, adult: Secondary | ICD-10-CM

## 2018-08-14 MED ORDER — PHENTERMINE HCL 37.5 MG PO TBDP: 1.0000 | ORAL_TABLET | Freq: Every day | ORAL | 0 refills | Status: DC

## 2018-08-14 MED ORDER — TOPIRAMATE 50 MG PO TABS: ORAL_TABLET | ORAL | 2 refills | Status: DC

## 2018-08-14 NOTE — Progress Notes (Signed)
Patient ID: Courtney Bailey, female   DOB: [birth year], 33 y.o.   MRN: [id number] .Marland KitchenVirtual Visit via Video Note  I connected with Courtney Bailey on 08/14/18 at  7:10 AM EDT by a video enabled telemedicine application and verified that I am speaking with the correct person using two identifiers.  Location: Patient: home Provider: clinic   I discussed the limitations of evaluation and management by telemedicine and the availability of in person appointments. The patient expressed understanding and agreed to proceed.  History of Present Illness: Pt is a 33 yo obese female who calls in to the clinic to follow up on weight. She has started phentermine 15mg  and topamax 50mg  daily. She is doing really well. She is sleeping well. No increase in anxiety. No palpitations.She is walking almost daily for at least 30 minutes. She has lost 13lbs in the last month.    .. Active Ambulatory Problems    Diagnosis Date Noted  . Class 2 obesity due to excess calories without serious comorbidity with body mass index (BMI) of 39.0 to 39.9 in adult 12/17/2017  . Seborrheic dermatitis of scalp 12/17/2017  . Itchy scalp 12/17/2017  . Eyelid dermatitis, allergic/contact, right 12/17/2017  . Elevated fasting glucose 12/18/2017  . Dyslipidemia (high LDL; low HDL) 12/18/2017  . Prediabetes 12/21/2017  . Perceived hearing changes 01/04/2018  . ETD (Eustachian tube dysfunction), bilateral 01/04/2018  . Depression, recurrent ([place]) 02/19/2018  . Anxiety 02/19/2018  . Primary insomnia 02/19/2018  . Seasonal allergies 04/30/2018  . Seasonal allergic rhinitis due to pollen 04/30/2018  . Allergic conjunctivitis of both eyes 04/30/2018  . Spotting between menses 05/14/2018   Resolved Ambulatory Problems    Diagnosis Date Noted  . No Resolved Ambulatory Problems   Past Medical History:  Diagnosis Date  . Disc degeneration, lumbar   . Sleep difficulties   . Weight gain    Reviewed med, allergy and problem list.     Observations/Objective: No acute distress. Normal breathing.  Normal mood.   .. Today's Vitals   08/14/18 0817  BP: 127/77  Weight: 236 lb 2 oz (107.1 kg)  Height: 5\' 7"  (1.702 m)   Body mass index is 36.98 kg/m.    Assessment and Plan: .Marland KitchenAydee was seen today for weight check.  Diagnoses and all orders for this visit:  Class 2 obesity due to excess calories without serious comorbidity with body mass index (BMI) of 36.0 to 36.9 in adult -     Phentermine HCl 37.5 MG TBDP; Take 1 tablet by mouth daily. -     topiramate (TOPAMAX) 50 MG tablet; One tablet twice a day.   Refilled medication. Pt is doing great.continue on same dose. 1/2 tablet of phentermine to keep side effects low.  Continue with exercise and good diet choices. Follow up in 3 months.   Needs CPE.   Follow Up Instructions:    I discussed the assessment and treatment plan with the patient. The patient was provided an opportunity to ask questions and all were answered. The patient agreed with the plan and demonstrated an understanding of the instructions.   The patient was advised to call back or seek an in-person evaluation if the symptoms worsen or if the condition fails to improve as anticipated.     Courtney Planas, PA-C

## 2018-09-03 ENCOUNTER — Telehealth: Payer: Self-pay | Admitting: Neurology

## 2018-09-03 DIAGNOSIS — Z6836 Body mass index (BMI) 36.0-36.9, adult: Secondary | ICD-10-CM

## 2018-09-03 DIAGNOSIS — E6609 Other obesity due to excess calories: Secondary | ICD-10-CM

## 2018-09-03 DIAGNOSIS — Z13220 Encounter for screening for lipoid disorders: Secondary | ICD-10-CM

## 2018-09-03 DIAGNOSIS — Z0000 Encounter for general adult medical examination without abnormal findings: Secondary | ICD-10-CM

## 2018-09-03 NOTE — Telephone Encounter (Signed)
Patient needs annual labs ordered for physical.  Please add any that are needed.

## 2018-09-18 DIAGNOSIS — Z0000 Encounter for general adult medical examination without abnormal findings: Secondary | ICD-10-CM | POA: Diagnosis not present

## 2018-09-18 DIAGNOSIS — R739 Hyperglycemia, unspecified: Secondary | ICD-10-CM | POA: Diagnosis not present

## 2018-09-18 DIAGNOSIS — Z13220 Encounter for screening for lipoid disorders: Secondary | ICD-10-CM | POA: Diagnosis not present

## 2018-09-18 DIAGNOSIS — Z6836 Body mass index (BMI) 36.0-36.9, adult: Secondary | ICD-10-CM | POA: Diagnosis not present

## 2018-09-18 DIAGNOSIS — E6609 Other obesity due to excess calories: Secondary | ICD-10-CM | POA: Diagnosis not present

## 2018-09-19 NOTE — Telephone Encounter (Signed)
Thyroid is good.  Glucose is elevated. Please add A!C to look for diabetes.  Your cholesterol is elevated as well. We can discuss this at physical.

## 2018-09-20 LAB — CBC WITH DIFFERENTIAL/PLATELET
Absolute Monocytes: 314 cells/uL (ref 200–950)
Basophils Absolute: 39 cells/uL (ref 0–200)
Basophils Relative: 0.7 %
Eosinophils Absolute: 209 cells/uL (ref 15–500)
Eosinophils Relative: 3.8 %
HCT: 40.5 % (ref 35.0–45.0)
Hemoglobin: 13.2 g/dL (ref 11.7–15.5)
Lymphs Abs: 1353 cells/uL (ref 850–3900)
MCH: 29.5 pg (ref 27.0–33.0)
MCHC: 32.6 g/dL (ref 32.0–36.0)
MCV: 90.4 fL (ref 80.0–100.0)
MPV: 10.4 fL (ref 7.5–12.5)
Monocytes Relative: 5.7 %
Neutro Abs: 3586 cells/uL (ref 1500–7800)
Neutrophils Relative %: 65.2 %
Platelets: 298 10*3/uL (ref 140–400)
RBC: 4.48 10*6/uL (ref 3.80–5.10)
RDW: 14 % (ref 11.0–15.0)
Total Lymphocyte: 24.6 %
WBC: 5.5 10*3/uL (ref 3.8–10.8)

## 2018-09-20 LAB — COMPLETE METABOLIC PANEL WITH GFR
AG Ratio: 1.6 (calc) (ref 1.0–2.5)
ALT: 12 U/L (ref 6–29)
AST: 14 U/L (ref 10–30)
Albumin: 4.4 g/dL (ref 3.6–5.1)
Alkaline phosphatase (APISO): 68 U/L (ref 31–125)
BUN: 8 mg/dL (ref 7–25)
CO2: 21 mmol/L (ref 20–32)
Calcium: 9.5 mg/dL (ref 8.6–10.2)
Chloride: 107 mmol/L (ref 98–110)
Creat: 0.8 mg/dL (ref 0.50–1.10)
GFR, Est African American: 114 mL/min/{1.73_m2} (ref >=60)
GFR, Est Non African American: 98 mL/min/{1.73_m2} (ref >=60)
Globulin: 2.8 g/dL (calc) (ref 1.9–3.7)
Glucose, Bld: 113 mg/dL — ABNORMAL HIGH (ref 65–99)
Potassium: 4.2 mmol/L (ref 3.5–5.3)
Sodium: 137 mmol/L (ref 135–146)
Total Bilirubin: 0.5 mg/dL (ref 0.2–1.2)
Total Protein: 7.2 g/dL (ref 6.1–8.1)

## 2018-09-20 LAB — LIPID PANEL
Cholesterol: 219 mg/dL — ABNORMAL HIGH (ref <200)
HDL: 43 mg/dL — ABNORMAL LOW (ref >=50)
LDL Cholesterol (Calc): 153 mg/dL (calc) — ABNORMAL HIGH
Non-HDL Cholesterol (Calc): 176 mg/dL (calc) — ABNORMAL HIGH (ref <130)
Total CHOL/HDL Ratio: 5.1 (calc) — ABNORMAL HIGH (ref <5.0)
Triglycerides: 116 mg/dL (ref <150)

## 2018-09-20 LAB — TSH: TSH: 1.98 mIU/L

## 2018-09-20 LAB — HEMOGLOBIN A1C W/OUT EAG: Hgb A1c MFr Bld: 5.9 % of total Hgb — ABNORMAL HIGH (ref <5.7)

## 2018-09-25 ENCOUNTER — Encounter: Payer: Self-pay | Admitting: Physician Assistant

## 2018-09-25 ENCOUNTER — Ambulatory Visit (INDEPENDENT_AMBULATORY_CARE_PROVIDER_SITE_OTHER): Payer: 59 | Admitting: Physician Assistant

## 2018-09-25 ENCOUNTER — Other Ambulatory Visit: Payer: Self-pay

## 2018-09-25 VITALS — BP 122/83 | HR 82 | Ht 67.0 in | Wt 230.0 lb

## 2018-09-25 DIAGNOSIS — E785 Hyperlipidemia, unspecified: Secondary | ICD-10-CM | POA: Diagnosis not present

## 2018-09-25 DIAGNOSIS — F5101 Primary insomnia: Secondary | ICD-10-CM | POA: Diagnosis not present

## 2018-09-25 DIAGNOSIS — Z0000 Encounter for general adult medical examination without abnormal findings: Secondary | ICD-10-CM

## 2018-09-25 DIAGNOSIS — R7303 Prediabetes: Secondary | ICD-10-CM

## 2018-09-25 DIAGNOSIS — E6609 Other obesity due to excess calories: Secondary | ICD-10-CM

## 2018-09-25 DIAGNOSIS — Z6836 Body mass index (BMI) 36.0-36.9, adult: Secondary | ICD-10-CM

## 2018-09-25 MED ORDER — TOPIRAMATE 50 MG PO TABS: ORAL_TABLET | ORAL | 1 refills | Status: DC

## 2018-09-25 MED ORDER — PHENTERMINE HCL 37.5 MG PO TABS: 37.5000 mg | ORAL_TABLET | Freq: Every day | ORAL | 0 refills | Status: DC

## 2018-09-25 NOTE — Progress Notes (Signed)
Subjective:     Courtney Bailey is a 33 y.o. female and is here for a comprehensive physical exam. The patient reports problems - continues to work on losing weight. she is doing great. she is having some trouble sleeping still. she is taking ambien as needed but wakes up and just can't go back to sleep. .  Social History   Socioeconomic History  . Marital status: Single    Spouse name: Not on file  . Number of children: Not on file  . Years of education: Not on file  . Highest education level: Not on file  Occupational History  . Not on file  Social Needs  . Financial resource strain: Not on file  . Food insecurity    Worry: Not on file    Inability: Not on file  . Transportation needs    Medical: Not on file    Non-medical: Not on file  Tobacco Use  . Smoking status: Never Smoker  . Smokeless tobacco: Never Used  Substance and Sexual Activity  . Alcohol use: Yes    Comment: occ  . Drug use: Never  . Sexual activity: Yes    Birth control/protection: None  Lifestyle  . Physical activity    Days per week: Not on file    Minutes per session: Not on file  . Stress: Not on file  Relationships  . Social Herbalist on phone: Not on file    Gets together: Not on file    Attends religious service: Not on file    Active member of club or organization: Not on file    Attends meetings of clubs or organizations: Not on file    Relationship status: Not on file  . Intimate partner violence    Fear of current or ex partner: Not on file    Emotionally abused: Not on file    Physically abused: Not on file    Forced sexual activity: Not on file  Other Topics Concern  . Not on file  Social History Narrative  . Not on file   Health Maintenance  Topic Date Due  . INFLUENZA VACCINE  08/31/2018  . HIV Screening  12/18/2018 (Originally 07/08/2002)  . PAP SMEAR-Modifier  03/20/2019  . TETANUS/TDAP  10/31/2027    The following portions of the patient's history were  reviewed and updated as appropriate: allergies, current medications, past family history, past medical history, past social history, past surgical history and problem list.  Review of Systems Pertinent items noted in HPI and remainder of comprehensive ROS otherwise negative.   Objective:    BP 122/83   Pulse 82   Ht 5\' 7"  (1.702 m)   Wt 230 lb (104.3 kg)   SpO2 97%   BMI 36.02 kg/m  General appearance: alert, cooperative, appears stated age and moderately obese Head: Normocephalic, without obvious abnormality, atraumatic Eyes: conjunctivae/corneas clear. PERRL, EOM's intact. Fundi benign. Ears: normal TM's and external ear canals both ears Nose: Nares normal. Septum midline. Mucosa normal. No drainage or sinus tenderness. Throat: lips, mucosa, and tongue normal; teeth and gums normal Neck: no adenopathy, no carotid bruit, no JVD, supple, symmetrical, trachea midline and thyroid not enlarged, symmetric, no tenderness/mass/nodules Back: symmetric, no curvature. ROM normal. No CVA tenderness. Lungs: clear to auscultation bilaterally Heart: regular rate and rhythm, S1, S2 normal, no murmur, click, rub or gallop Abdomen: soft, non-tender; bowel sounds normal; no masses,  no organomegaly Extremities: extremities normal, atraumatic, no cyanosis or edema  Pulses: 2+ and symmetric Skin: Skin color, texture, turgor normal. No rashes or lesions Lymph nodes: Cervical, supraclavicular, and axillary nodes normal. Neurologic: Alert and oriented X 3, normal strength and tone. Normal symmetric reflexes. Normal coordination and gait    Assessment:    Healthy female exam.      Plan:    .Marland Kitchen.Shanda BumpsJessica was seen today for annual exam.  Diagnoses and all orders for this visit:  Routine physical examination  Class 2 obesity due to excess calories without serious comorbidity with body mass index (BMI) of 36.0 to 36.9 in adult -     topiramate (TOPAMAX) 50 MG tablet; One tablet twice a day. -      phentermine (ADIPEX-P) 37.5 MG tablet; Take 1 tablet (37.5 mg total) by mouth daily before breakfast.  Primary insomnia  Dyslipidemia (high LDL; low HDL)  Prediabetes  .Marland Kitchen. Depression screen [facility] 2/9 09/25/2018 12/17/2017  Decreased Interest 0 0  Down, Depressed, Hopeless 0 0  PHQ - 2 Score 0 0  Altered sleeping 2 1  Tired, decreased energy 0 0  Change in appetite 0 0  Feeling bad or failure about yourself  0 0  Trouble concentrating 0 1  Moving slowly or fidgety/restless 0 0  Suicidal thoughts 0 0  PHQ-9 Score 2 2  Difficult doing work/chores Somewhat difficult Not difficult at all   .Marland Kitchen. Discussed 150 minutes of exercise a week.  Encouraged vitamin D 1000 units and Calcium 1300mg  or 4 servings of dairy a day.  Pap up to date.  Pt will get flu shot at work.   Marland Kitchen..Discussed low carb diet with 1500 calories and 80g of protein.  Exercising at least 150 minutes a week.  My Fitness Pal could be a Chief Technology Officergreat resource.  Discussed IF 16:8 Continue phentermine but cut back to 1/4 tablet and see if will help sleep better.  Continue topamax.  Follow up in 3 months.   Encouraged good sleep routine. Discussed sleep hygiene.   Discussed labs she has dyslipidemia and pre-diabetes. She is working on weight loss and diet changes. Suggested red yeast rice, wheat grass and bcomplex.    See After Visit Summary for Counseling Recommendations

## 2018-09-25 NOTE — Patient Instructions (Signed)
Health Maintenance, Female Adopting a healthy lifestyle and getting preventive care are important in promoting health and wellness. Ask your health care provider about:  The right schedule for you to have regular tests and exams.  Things you can do on your own to prevent diseases and keep yourself healthy. What should I know about diet, weight, and exercise? Eat a healthy diet   Eat a diet that includes plenty of vegetables, fruits, low-fat dairy products, and lean protein.  Do not eat a lot of foods that are high in solid fats, added sugars, or sodium. Maintain a healthy weight Body mass index (BMI) is used to identify weight problems. It estimates body fat based on height and weight. Your health care provider can help determine your BMI and help you achieve or maintain a healthy weight. Get regular exercise Get regular exercise. This is one of the most important things you can do for your health. Most adults should:  Exercise for at least 150 minutes each week. The exercise should increase your heart rate and make you sweat (moderate-intensity exercise).  Do strengthening exercises at least twice a week. This is in addition to the moderate-intensity exercise.  Spend less time sitting. Even light physical activity can be beneficial. Watch cholesterol and blood lipids Have your blood tested for lipids and cholesterol at 31 years of age, then have this test every 5 years. Have your cholesterol levels checked more often if:  Your lipid or cholesterol levels are high.  You are older than 31 years of age.  You are at high risk for heart disease. What should I know about cancer screening? Depending on your health history and family history, you may need to have cancer screening at various ages. This may include screening for:  Breast cancer.  Cervical cancer.  Colorectal cancer.  Skin cancer.  Lung cancer. What should I know about heart disease, diabetes, and high blood  pressure? Blood pressure and heart disease  High blood pressure causes heart disease and increases the risk of stroke. This is more likely to develop in people who have high blood pressure readings, are of African descent, or are overweight.  Have your blood pressure checked: ? Every 3-5 years if you are 18-39 years of age. ? Every year if you are 40 years old or older. Diabetes Have regular diabetes screenings. This checks your fasting blood sugar level. Have the screening done:  Once every three years after age 40 if you are at a normal weight and have a low risk for diabetes.  More often and at a younger age if you are overweight or have a high risk for diabetes. What should I know about preventing infection? Hepatitis B If you have a higher risk for hepatitis B, you should be screened for this virus. Talk with your health care provider to find out if you are at risk for hepatitis B infection. Hepatitis C Testing is recommended for:  Everyone born from 1945 through 1965.  Anyone with known risk factors for hepatitis C. Sexually transmitted infections (STIs)  Get screened for STIs, including gonorrhea and chlamydia, if: ? You are sexually active and are younger than 31 years of age. ? You are older than 31 years of age and your health care provider tells you that you are at risk for this type of infection. ? Your sexual activity has changed since you were last screened, and you are at increased risk for chlamydia or gonorrhea. Ask your health care provider if   you are at risk.  Ask your health care provider about whether you are at high risk for HIV. Your health care provider may recommend a prescription medicine to help prevent HIV infection. If you choose to take medicine to prevent HIV, you should first get tested for HIV. You should then be tested every 3 months for as long as you are taking the medicine. Pregnancy  If you are about to stop having your period (premenopausal) and  you may become pregnant, seek counseling before you get pregnant.  Take 400 to 800 micrograms (mcg) of folic acid every day if you become pregnant.  Ask for birth control (contraception) if you want to prevent pregnancy. Osteoporosis and menopause Osteoporosis is a disease in which the bones lose minerals and strength with aging. This can result in bone fractures. If you are 65 years old or older, or if you are at risk for osteoporosis and fractures, ask your health care provider if you should:  Be screened for bone loss.  Take a calcium or vitamin D supplement to lower your risk of fractures.  Be given hormone replacement therapy (HRT) to treat symptoms of menopause. Follow these instructions at home: Lifestyle  Do not use any products that contain nicotine or tobacco, such as cigarettes, e-cigarettes, and chewing tobacco. If you need help quitting, ask your health care provider.  Do not use street drugs.  Do not share needles.  Ask your health care provider for help if you need support or information about quitting drugs. Alcohol use  Do not drink alcohol if: ? Your health care provider tells you not to drink. ? You are pregnant, may be pregnant, or are planning to become pregnant.  If you drink alcohol: ? Limit how much you use to 0-1 drink a day. ? Limit intake if you are breastfeeding.  Be aware of how much alcohol is in your drink. In the [place] one drink equals one 12 oz bottle of beer (355 mL), one 5 oz glass of wine (148 mL), or one 1 oz glass of hard liquor (44 mL). General instructions  Schedule regular health, dental, and eye exams.  Stay current with your vaccines.  Tell your health care provider if: ? You often feel depressed. ? You have ever been abused or do not feel safe at home. Summary  Adopting a healthy lifestyle and getting preventive care are important in promoting health and wellness.  Follow your health care provider's instructions about healthy  diet, exercising, and getting tested or screened for diseases.  Follow your health care provider's instructions on monitoring your cholesterol and blood pressure. This information is not intended to replace advice given to you by your health care provider. Make sure you discuss any questions you have with your health care provider. Document Released: 08/01/2010 Document Revised: 01/09/2018 Document Reviewed: 01/09/2018 Elsevier Patient Education  2020 Elsevier Inc.  

## 2018-10-17 ENCOUNTER — Other Ambulatory Visit: Payer: Self-pay | Admitting: Neurology

## 2018-10-17 MED ORDER — KETOCONAZOLE 2 % EX SHAM: 1.0000 "application " | MEDICATED_SHAMPOO | CUTANEOUS | 1 refills | Status: DC

## 2018-11-08 ENCOUNTER — Ambulatory Visit: Payer: 59

## 2018-11-22 ENCOUNTER — Ambulatory Visit (INDEPENDENT_AMBULATORY_CARE_PROVIDER_SITE_OTHER): Payer: 59 | Admitting: Physician Assistant

## 2018-11-22 ENCOUNTER — Encounter: Payer: Self-pay | Admitting: Physician Assistant

## 2018-11-22 VITALS — BP 110/73 | HR 90 | Ht 67.0 in | Wt 215.0 lb

## 2018-11-22 DIAGNOSIS — Z6833 Body mass index (BMI) 33.0-33.9, adult: Secondary | ICD-10-CM

## 2018-11-22 DIAGNOSIS — N6310 Unspecified lump in the right breast, unspecified quadrant: Secondary | ICD-10-CM | POA: Diagnosis not present

## 2018-11-22 DIAGNOSIS — E6609 Other obesity due to excess calories: Secondary | ICD-10-CM | POA: Diagnosis not present

## 2018-11-22 MED ORDER — PHENTERMINE HCL 37.5 MG PO TABS: 37.5000 mg | ORAL_TABLET | Freq: Every day | ORAL | 0 refills | Status: DC

## 2018-11-22 NOTE — Progress Notes (Signed)
   Subjective:    Patient ID: Courtney Bailey, female    DOB: 1985/11/19, 33 y.o.   MRN: [id number]  HPI  Pt is a 33 yo female who presents to clinic to evaluate a right breast mass that she found about 2 weeks ago. She does feel like it is getting more noticable, not painful, no nipple discharge or retraction. No personal or family history of breast cancer. She had mammogram back in 2017 due to bilateral lumpy breast. It was normal with no suspicious findings. She is working on losing weight and lost 15lbs over last 2 months. She denies any fever, chills, night sweats. She is "tired" a lot but her normal fatigue. She uses ambien for sleep and helps a lot when she uses.   2.5cm by 3cm   .Marland Kitchen Active Ambulatory Problems    Diagnosis Date Noted  . Class 2 obesity due to excess calories without serious comorbidity with body mass index (BMI) of 39.0 to 39.9 in adult 12/17/2017  . Seborrheic dermatitis of scalp 12/17/2017  . Itchy scalp 12/17/2017  . Eyelid dermatitis, allergic/contact, right 12/17/2017  . Elevated fasting glucose 12/18/2017  . Dyslipidemia (high LDL; low HDL) 12/18/2017  . Prediabetes 12/21/2017  . Perceived hearing changes 01/04/2018  . ETD (Eustachian tube dysfunction), bilateral 01/04/2018  . Depression, recurrent ([place]) 02/19/2018  . Anxiety 02/19/2018  . Primary insomnia 02/19/2018  . Seasonal allergies 04/30/2018  . Seasonal allergic rhinitis due to pollen 04/30/2018  . Allergic conjunctivitis of both eyes 04/30/2018  . Spotting between menses 05/14/2018   Resolved Ambulatory Problems    Diagnosis Date Noted  . No Resolved Ambulatory Problems   Past Medical History:  Diagnosis Date  . Disc degeneration, lumbar   . Sleep difficulties   . Weight gain      Review of Systems See HPI.     Objective:   Physical Exam Vitals signs reviewed.  Constitutional:      Appearance: Normal appearance.  Cardiovascular:     Rate and Rhythm: Normal rate and regular rhythm.      Pulses: Normal pulses.  Pulmonary:     Effort: Pulmonary effort is normal.     Breath sounds: Normal breath sounds.  Chest:    Neurological:     Mental Status: She is alert.           Assessment & Plan:  .Marland KitchenIcy was seen today for follow-up.  Diagnoses and all orders for this visit:  Breast mass, right -     MM DIAG BREAST TOMO BILATERAL -     US BREAST LTD UNI RIGHT INC AXILLA  Class 2 obesity due to excess calories without serious comorbidity with body mass index (BMI) of 36.0 to 36.9 in adult -     phentermine (ADIPEX-P) 37.5 MG tablet; Take 1 tablet (37.5 mg total) by mouth daily before breakfast.   Needs imaging. Sent diagnostic and ultrasound order.  Concerned about possible lymph node in axilla as well.  No personal or family hx of BC. She has had some weight loss and fatigue. She has been trying with diet and exercise and medication.   Continue to heat healthy and exercise.  Phentermine refilled.  Use in combination with topamax.

## 2018-11-26 ENCOUNTER — Other Ambulatory Visit: Payer: Self-pay | Admitting: Physician Assistant

## 2018-11-26 ENCOUNTER — Ambulatory Visit
Admission: RE | Admit: 2018-11-26 | Discharge: 2018-11-26 | Disposition: A | Discharge status: Home / Self Care | Payer: 59 | Source: Ambulatory Visit | Attending: Physician Assistant | Admitting: Physician Assistant

## 2018-11-26 ENCOUNTER — Other Ambulatory Visit: Payer: Self-pay

## 2018-11-26 DIAGNOSIS — N6320 Unspecified lump in the left breast, unspecified quadrant: Secondary | ICD-10-CM

## 2018-11-26 DIAGNOSIS — N6022 Fibroadenosis of left breast: Secondary | ICD-10-CM | POA: Diagnosis not present

## 2018-11-26 DIAGNOSIS — N6001 Solitary cyst of right breast: Secondary | ICD-10-CM | POA: Diagnosis not present

## 2018-11-26 DIAGNOSIS — R922 Inconclusive mammogram: Secondary | ICD-10-CM | POA: Diagnosis not present

## 2018-11-26 NOTE — Progress Notes (Signed)
Likely benign cyst of right breast.  Fibroadenoma of the left breast.

## 2018-12-02 ENCOUNTER — Other Ambulatory Visit: Payer: 59

## 2018-12-16 ENCOUNTER — Encounter: Payer: Self-pay | Admitting: Physician Assistant

## 2018-12-16 ENCOUNTER — Ambulatory Visit (INDEPENDENT_AMBULATORY_CARE_PROVIDER_SITE_OTHER): Payer: 59 | Admitting: Physician Assistant

## 2018-12-16 ENCOUNTER — Other Ambulatory Visit: Payer: Self-pay

## 2018-12-16 VITALS — BP 120/74 | HR 92 | Temp 98.4°F

## 2018-12-16 DIAGNOSIS — J3489 Other specified disorders of nose and nasal sinuses: Secondary | ICD-10-CM

## 2018-12-16 DIAGNOSIS — R05 Cough: Secondary | ICD-10-CM | POA: Diagnosis not present

## 2018-12-16 DIAGNOSIS — Z20828 Contact with and (suspected) exposure to other viral communicable diseases: Secondary | ICD-10-CM

## 2018-12-16 DIAGNOSIS — R040 Epistaxis: Secondary | ICD-10-CM | POA: Diagnosis not present

## 2018-12-16 DIAGNOSIS — R059 Cough, unspecified: Secondary | ICD-10-CM

## 2018-12-16 DIAGNOSIS — Z20822 Contact with and (suspected) exposure to covid-19: Secondary | ICD-10-CM

## 2018-12-16 NOTE — Progress Notes (Signed)
Acute Office Visit  Subjective:    Patient ID: Courtney Bailey, female    DOB: 11-Jul-1985, 33 y.o.   MRN: [id number]  Chief Complaint  Patient presents with  . Follow-up    HPI Patient is in today for sinus pressure, ear pressure, blowing out "black stuff".   Past Medical History:  Diagnosis Date  . Disc degeneration, lumbar   . Sleep difficulties   . Weight gain     Past Surgical History:  Procedure Laterality Date  . COLONOSCOPY      Family History  Problem Relation Age of Onset  . Hypothyroidism Sister   . Breast cancer Neg Hx     Social History   Socioeconomic History  . Marital status: Single    Spouse name: Not on file  . Number of children: Not on file  . Years of education: Not on file  . Highest education level: Not on file  Occupational History  . Not on file  Social Needs  . Financial resource strain: Not on file  . Food insecurity    Worry: Not on file    Inability: Not on file  . Transportation needs    Medical: Not on file    Non-medical: Not on file  Tobacco Use  . Smoking status: Never Smoker  . Smokeless tobacco: Never Used  Substance and Sexual Activity  . Alcohol use: Yes    Comment: occ  . Drug use: Never  . Sexual activity: Yes    Birth control/protection: None  Lifestyle  . Physical activity    Days per week: Not on file    Minutes per session: Not on file  . Stress: Not on file  Relationships  . Social Musician on phone: Not on file    Gets together: Not on file    Attends religious service: Not on file    Active member of club or organization: Not on file    Attends meetings of clubs or organizations: Not on file    Relationship status: Not on file  . Intimate partner violence    Fear of current or ex partner: Not on file    Emotionally abused: Not on file    Physically abused: Not on file    Forced sexual activity: Not on file  Other Topics Concern  . Not on file  Social History Narrative  . Not on  file    Outpatient Medications Prior to Visit  Medication Sig Dispense Refill  . cyclobenzaprine (FLEXERIL) 10 MG tablet Take 0.5-1 tablets (5-10 mg total) by mouth 3 (three) times daily as needed for muscle spasms. Caution: can cause drowsiness 60 tablet 1  . ketoconazole (NIZORAL) 2 % shampoo Apply 1 application topically 2 (two) times a week. 120 mL 1  . phentermine (ADIPEX-P) 37.5 MG tablet Take 1 tablet (37.5 mg total) by mouth daily before breakfast. 30 tablet 0  . topiramate (TOPAMAX) 50 MG tablet One tablet twice a day. 180 tablet 1   No facility-administered medications prior to visit.     Allergies  Allergen Reactions  . Penicillin G Other (See Comments)    Does not know reaction. Occurred in early childhood Does not know reaction. Occurred in early childhood   . Trazodone Other (See Comments)    Nightmares  . Escitalopram Oxalate Anxiety    Worsening anxiety symptoms.    ROS See HPI.     Objective:    Physical Exam  Constitutional: She is  oriented to person, place, and time. She appears well-developed and well-nourished.  HENT:  Head: Normocephalic and atraumatic.  Right Ear: External ear normal.  Left Ear: External ear normal.  Oropharynx erythematous and appears slightly swollen. No exudate. Sinus drainage clear.   Bilateral TM with some dullness and fluid behind TM.   Nasal turbinates red and swollen. No active bleeding.   Eyes: Conjunctivae are normal. Right eye exhibits no discharge. Left eye exhibits no discharge.  Cardiovascular: Normal rate.  Pulmonary/Chest: Effort normal and breath sounds normal.  Lymphadenopathy:    She has no cervical adenopathy.  Neurological: She is alert and oriented to person, place, and time.  Psychiatric: She has a normal mood and affect. Her behavior is normal.    BP 120/74   Pulse 92   Temp 98.4 F (36.9 C) (Oral)   LMP 11/17/2018   SpO2 100%  Wt Readings from Last 3 Encounters:  11/22/18 215 lb (97.5 kg)   09/25/18 230 lb (104.3 kg)  08/14/18 236 lb 2 oz (107.1 kg)    There are no preventive care reminders to display for this patient.  There are no preventive care reminders to display for this patient.   Lab Results  Component Value Date   TSH 1.98 09/18/2018   Lab Results  Component Value Date   WBC 5.5 09/18/2018   HGB 13.2 09/18/2018   HCT 40.5 09/18/2018   MCV 90.4 09/18/2018   PLT 298 09/18/2018   Lab Results  Component Value Date   NA 137 09/18/2018   K 4.2 09/18/2018   CO2 21 09/18/2018   GLUCOSE 113 (H) 09/18/2018   BUN 8 09/18/2018   CREATININE 0.80 09/18/2018   BILITOT 0.5 09/18/2018   AST 14 09/18/2018   ALT 12 09/18/2018   PROT 7.2 09/18/2018   CALCIUM 9.5 09/18/2018   Lab Results  Component Value Date   CHOL 219 (H) 09/18/2018   Lab Results  Component Value Date   HDL 43 (L) 09/18/2018   Lab Results  Component Value Date   LDLCALC 153 (H) 09/18/2018   Lab Results  Component Value Date   TRIG 116 09/18/2018   Lab Results  Component Value Date   CHOLHDL 5.1 (H) 09/18/2018   Lab Results  Component Value Date   HGBA1C 5.9 (H) 09/18/2018       Assessment & Plan:   Problem List Items Addressed This Visit    None    Visit Diagnoses    Suspected COVID-19 virus infection    -  Primary   Cough       Sinus pressure       Epistaxis         Pt came into work today wanting to be seen. Needs COVID testing. Please self isolate until results are in. Likely viral. Sent atrovent nasal spray. Ibuprofen/mucinex for symptoms. Rest and hydrate. Follow up as needed.    No orders of the defined types were placed in this encounter.    Iran Planas, PA-C

## 2018-12-17 ENCOUNTER — Encounter: Payer: Self-pay | Admitting: Physician Assistant

## 2018-12-17 DIAGNOSIS — U071 COVID-19: Secondary | ICD-10-CM

## 2018-12-18 ENCOUNTER — Encounter: Payer: Self-pay | Admitting: Physician Assistant

## 2018-12-18 MED ORDER — BENZONATATE 200 MG PO CAPS: 200.0000 mg | ORAL_CAPSULE | Freq: Two times a day (BID) | ORAL | 0 refills | Status: DC | PRN

## 2018-12-18 NOTE — Telephone Encounter (Signed)
Pt is coughing a lot. Suggested robatussin and mucinex. Sent tessalon pearls. Follow up as needed.

## 2018-12-19 ENCOUNTER — Telehealth: Payer: Self-pay | Admitting: Physician Assistant

## 2018-12-19 MED ORDER — HYDROCOD POLST-CPM POLST ER 10-8 MG/5ML PO SUER: 5.0000 mL | Freq: Two times a day (BID) | ORAL | 0 refills | Status: DC | PRN

## 2018-12-19 MED ORDER — ALBUTEROL SULFATE HFA 108 (90 BASE) MCG/ACT IN AERS: 2.0000 | INHALATION_SPRAY | RESPIRATORY_TRACT | 0 refills | Status: DC | PRN

## 2018-12-19 NOTE — Telephone Encounter (Signed)
covid positive. Coughing and trouble breathing. Albuterol/tussionex sent to pharmacy.

## 2019-01-06 ENCOUNTER — Telehealth: Payer: Self-pay | Admitting: Physician Assistant

## 2019-01-06 DIAGNOSIS — F419 Anxiety disorder, unspecified: Secondary | ICD-10-CM

## 2019-01-06 NOTE — Telephone Encounter (Signed)
Pt requested referral for counseling for anxiety.

## 2019-02-05 ENCOUNTER — Ambulatory Visit (INDEPENDENT_AMBULATORY_CARE_PROVIDER_SITE_OTHER): Payer: 59 | Admitting: Psychology

## 2019-02-05 DIAGNOSIS — F411 Generalized anxiety disorder: Secondary | ICD-10-CM | POA: Diagnosis not present

## 2019-02-13 ENCOUNTER — Ambulatory Visit (INDEPENDENT_AMBULATORY_CARE_PROVIDER_SITE_OTHER): Payer: 59 | Admitting: Psychology

## 2019-02-13 DIAGNOSIS — F410 Panic disorder [episodic paroxysmal anxiety] without agoraphobia: Secondary | ICD-10-CM

## 2019-02-13 DIAGNOSIS — F411 Generalized anxiety disorder: Secondary | ICD-10-CM | POA: Diagnosis not present

## 2019-02-18 ENCOUNTER — Ambulatory Visit (INDEPENDENT_AMBULATORY_CARE_PROVIDER_SITE_OTHER): Payer: 59 | Admitting: Psychology

## 2019-02-18 DIAGNOSIS — F411 Generalized anxiety disorder: Secondary | ICD-10-CM | POA: Diagnosis not present

## 2019-02-18 DIAGNOSIS — F410 Panic disorder [episodic paroxysmal anxiety] without agoraphobia: Secondary | ICD-10-CM | POA: Diagnosis not present

## 2019-02-19 ENCOUNTER — Ambulatory Visit (INDEPENDENT_AMBULATORY_CARE_PROVIDER_SITE_OTHER): Payer: 59 | Admitting: Physician Assistant

## 2019-02-19 ENCOUNTER — Ambulatory Visit: Payer: 59 | Admitting: Physician Assistant

## 2019-02-19 ENCOUNTER — Encounter: Payer: Self-pay | Admitting: Physician Assistant

## 2019-02-19 VITALS — BP 120/76 | HR 75 | Ht 67.0 in | Wt 215.0 lb

## 2019-02-19 DIAGNOSIS — R519 Headache, unspecified: Secondary | ICD-10-CM

## 2019-02-19 DIAGNOSIS — F339 Major depressive disorder, recurrent, unspecified: Secondary | ICD-10-CM

## 2019-02-19 DIAGNOSIS — E6609 Other obesity due to excess calories: Secondary | ICD-10-CM | POA: Diagnosis not present

## 2019-02-19 DIAGNOSIS — F5101 Primary insomnia: Secondary | ICD-10-CM

## 2019-02-19 DIAGNOSIS — R21 Rash and other nonspecific skin eruption: Secondary | ICD-10-CM | POA: Diagnosis not present

## 2019-02-19 DIAGNOSIS — Z6833 Body mass index (BMI) 33.0-33.9, adult: Secondary | ICD-10-CM | POA: Diagnosis not present

## 2019-02-19 DIAGNOSIS — E66811 Obesity, class 1: Secondary | ICD-10-CM

## 2019-02-19 MED ORDER — BELSOMRA 10 MG PO TABS: 1.0000 | ORAL_TABLET | Freq: Every day | ORAL | 1 refills | Status: DC

## 2019-02-19 MED ORDER — TRIAMCINOLONE ACETONIDE 0.1 % EX CREA: 1.0000 "application " | TOPICAL_CREAM | Freq: Two times a day (BID) | CUTANEOUS | 0 refills | Status: DC

## 2019-02-19 NOTE — Patient Instructions (Signed)
Benign Essential Blepharospasm, Adult °Benign essential blepharospasm (BEB) is a nervous system condition that makes a person close his or her eyes without meaning to. Over time, episodes of this condition may gradually become more frequent and forceful, and eventually involve both eyes. This can make it hard for you to keep your eyes open and do activities such as watching television, driving, or reading. If this condition is not treated, the eyes may close forcefully for long periods of time. °What are the causes? °The exact cause of this condition is not known. The condition may be passed down through families through an abnormal gene. Symptoms of the condition may be triggered by: °· The wind. °· Sunlight. °· Noise. °· Stress. °· Fatigue. °· Bright light. °· Reading. °· Driving. °· Walking outside. °· Air pollution. °· Eye strain. °What increases the risk? °You are more likely to develop this condition if: °· You are age 50 or older. °· You have a family history of the condition. °· You are female. °· There are problems with the part of your brain that controls movements (basal ganglia). °· You have a movement disorder called dystonia. °· You have a history of eye diseases or trauma to the eye(s). °· You take certain medicines, such as medicines that treat Parkinson disease. °What are the signs or symptoms? °The first symptom of this condition is frequent eye blinking or twitching that you cannot control. It may happen during the day and disappear at night. Other early symptoms include: °· Eye dryness and irritation. °· Eye irritation or pain from bright lights (photophobia). °You may get temporary relief from your symptoms when you sing, yawn, chew, or laugh. °Later symptoms of this condition include: °· Winking and squinting for longer than usual. °· Muscle spasms in your tongue and jaw (Meigesyndrome). °· Inability to keep your eyes open for long periods of time. °Over time, symptoms may get stronger and last  longer. °How is this diagnosed? °This condition is diagnosed based on your symptoms, your medical history, and a physical exam. °How is this treated? °There is no cure for this condition, but treatment can help with symptoms. Treatment options include: °· Applying moisturizing eye drops (artificial tears) to the eye. These drops help to relieve eye irritation and dryness. °· Getting an injection of botulinum toxin into the muscles that control eyelid movement. This treatment may need to be repeated every few months. °· Taking medicines such as muscle relaxants and anti-anxiety medicines. °· Having surgery to remove part of the eyelid muscles (myectomy). This may be done if injections of botulinum toxin do not work or they stop working. °Follow these instructions at home: °Lifestyle °· Wear tinted sunglasses that block UV (ultraviolet) light. °· Wear eye protection outdoors on windy days. °· Get enough sleep. °· Try to manage or avoid stressful situations. °· Do not watch TV or have screen time for long periods of time. °· Avoid things that trigger your condition. °General instructions °· Learn as much as you can about your condition. °· Work closely with your team of health care providers. °· Use over-the-counter and prescription medicines, including eye drops, only as told by your health care provider. °· Keep all follow-up visits as told by your health care provider. This is important. °· Do not drive if you are having an episode that affects how well you can see. °· Keep your eyelids clean. Wash them daily with mild soap and water. This will help to prevent irritation and infection. °Contact   a health care provider if: °· Your symptoms are not controlled with treatment. °· Your eyes are red, teary, or dry. °· Your eyes droop. °· You feel anxious or depressed. °Get help right away if: °· You cannot open your eyes. °Summary °· Benign essential blepharospasm (BEB) is a nervous system condition that makes a person  close his or her eyes without meaning to. °· The exact cause of this condition is not known. The condition may be passed down through families through an abnormal gene. °· There is no cure for this condition, but treatment can help with symptoms. °This information is not intended to replace advice given to you by your health care provider. Make sure you discuss any questions you have with your health care provider. °Document Revised: 12/29/2016 Document Reviewed: 03/21/2016 °Elsevier Patient Education © 2020 Else[employer] Inc. ° °

## 2019-02-19 NOTE — Progress Notes (Signed)
Rash on left chest Trouble with left eye twitching headaches

## 2019-02-19 NOTE — Progress Notes (Signed)
Subjective:    Patient ID: Courtney Bailey, female    DOB: [birth year], 34 y.o.   MRN: [id number]  HPI  Pt is a 34 yo female who presents to the clinic with rash of upper left chest wall for the last few days but needs to follow up as well.   Her rashes itches but has started to hurt a little as well. Heat seems to make worse. She did start using a soap that is new but this is the only spot of rash. She denies any vesicles. Never had anything like this before. She has been using coconut oil on it which has not helped.   She is coming off topamax that we were using for weight. It seemed to be making her depression worse. She does feel better but not getting more frequent headaches. She has a headache as least 4 times a week. Frontal to temples. No nausea, vomiting, light or sound sensitivity. Aleve usually helps.   She is actively working on weight loss she continues to phentermine. She does notice problems with sleep when she takes phentermine. Honestly she has sleep issues no matter what she takes. Even if she goes to sleep she wakes up in the middle of the night and cannot go back to sleep. She is down from 250 to 215. She is exercising regularly and drinking more water.   .. Active Ambulatory Problems    Diagnosis Date Noted  . Class 1 obesity due to excess calories without serious comorbidity with body mass index (BMI) of 33.0 to 33.9 in adult 12/17/2017  . Seborrheic dermatitis of scalp 12/17/2017  . Itchy scalp 12/17/2017  . Eyelid dermatitis, allergic/contact, right 12/17/2017  . Elevated fasting glucose 12/18/2017  . Dyslipidemia (high LDL; low HDL) 12/18/2017  . Prediabetes 12/21/2017  . Perceived hearing changes 01/04/2018  . ETD (Eustachian tube dysfunction), bilateral 01/04/2018  . Depression, recurrent ([place]) 02/19/2018  . Anxiety 02/19/2018  . Primary insomnia 02/19/2018  . Seasonal allergies 04/30/2018  . Seasonal allergic rhinitis due to pollen 04/30/2018  . Allergic  conjunctivitis of both eyes 04/30/2018  . Spotting between menses 05/14/2018  . Breast mass, right 11/22/2018  . Frequent headaches 02/21/2019   Resolved Ambulatory Problems    Diagnosis Date Noted  . No Resolved Ambulatory Problems   Past Medical History:  Diagnosis Date  . Disc degeneration, lumbar   . Sleep difficulties   . Weight gain          Review of Systems See HPI.     Objective:   Physical Exam Vitals reviewed.  Constitutional:      Appearance: She is obese.  Cardiovascular:     Rate and Rhythm: Normal rate.     Pulses: Normal pulses.  Pulmonary:     Effort: Pulmonary effort is normal.  Skin:    Comments: Circular area of redness with tiny red papules and excoriations seen from scratching. No vesicles or scales.   Neurological:     General: No focal deficit present.     Mental Status: She is alert.  Psychiatric:        Mood and Affect: Mood normal.           Assessment & Plan:  .Marland KitchenNanetta was seen today for rash.  Diagnoses and all orders for this visit:  Rash and nonspecific skin eruption -     triamcinolone cream (KENALOG) 0.1 %; Apply 1 application topically 2 (two) times daily.  Depression, recurrent ([place])  Class 1  obesity due to excess calories without serious comorbidity with body mass index (BMI) of 33.0 to 33.9 in adult  Frequent headaches  Primary insomnia -     Suvorexant (BELSOMRA) 10 MG TABS; Take 1 tablet by mouth at bedtime.   Unclear etiology of rash appears like some contact dermitis or irritant dermaitis. Cream given to try for next few days. Cool compresses and keep moisturized.   Coming off topamax could be causing headaches to worsen. HA is also SE of phentermine. Stop both. Reset and lets see if headaches resolve. Started belsomra for sleep. Lack of sleep can cause headaches as well.   Discussed other weight loss medications. Continue exercise and diet. If starting to see weight creep back up lets look into other  options.   Follow up in 1 month.

## 2019-02-21 DIAGNOSIS — R519 Headache, unspecified: Secondary | ICD-10-CM | POA: Insufficient documentation

## 2019-02-26 ENCOUNTER — Other Ambulatory Visit: Payer: Self-pay | Admitting: Neurology

## 2019-02-26 DIAGNOSIS — E6609 Other obesity due to excess calories: Secondary | ICD-10-CM

## 2019-02-26 MED ORDER — PHENTERMINE HCL 37.5 MG PO TABS: 37.5000 mg | ORAL_TABLET | Freq: Every day | ORAL | 0 refills | Status: DC

## 2019-02-26 NOTE — Telephone Encounter (Signed)
Patient requested refill on phentermine. She states completely out, its been fine and not terrible headaches like before she stopped the topamax. Last filled 11/22/2018 #30 with no refills.

## 2019-02-27 ENCOUNTER — Ambulatory Visit (INDEPENDENT_AMBULATORY_CARE_PROVIDER_SITE_OTHER): Payer: 59 | Admitting: Sports Medicine

## 2019-02-27 ENCOUNTER — Ambulatory Visit (INDEPENDENT_AMBULATORY_CARE_PROVIDER_SITE_OTHER): Payer: 59 | Admitting: Psychology

## 2019-02-27 VITALS — BP 103/69 | HR 85

## 2019-02-27 DIAGNOSIS — F411 Generalized anxiety disorder: Secondary | ICD-10-CM | POA: Diagnosis not present

## 2019-02-27 DIAGNOSIS — F410 Panic disorder [episodic paroxysmal anxiety] without agoraphobia: Secondary | ICD-10-CM

## 2019-02-27 DIAGNOSIS — F419 Anxiety disorder, unspecified: Secondary | ICD-10-CM | POA: Diagnosis not present

## 2019-02-27 MED ORDER — CLONAZEPAM 0.5 MG PO TABS: 0.5000 mg | ORAL_TABLET | Freq: Every day | ORAL | 0 refills | Status: DC | PRN

## 2019-02-27 MED ORDER — CITALOPRAM HYDROBROMIDE 10 MG PO TABS: 10.0000 mg | ORAL_TABLET | Freq: Every day | ORAL | 3 refills | Status: DC

## 2019-02-27 MED ORDER — CLONAZEPAM 0.5 MG PO TBDP
0.5000 mg | ORAL_TABLET | Freq: Once | ORAL | Status: AC
  Administered 2019-02-27: 0.5 mg via ORAL

## 2019-02-27 NOTE — Progress Notes (Signed)
    Procedures performed today:    None.  Independent interpretation of tests performed by another provider:   None.  Impression and Recommendations:    Generalized anxiety disorder Discuss has suffered from generalized anxiety disorder for some time now, she also has mild depressive symptoms. She has been on several medications and has seen a psychiatrist in the past. She has been on Zoloft and Lexapro without sufficient or good response, I think the issue may have been insufficient trial periods. She also gets about 4 panic attacks per month, the most recent 1 necessitated a call to 911, ultimately they just talked her down. Today she is having some panic, she is tearful, uneasy, mild shortness of breath. Klonopin 0.5 mg given today orally. I do think she needs a controller agent, we will use Celexa starting at 10 mg daily with a slow up titration to 40 mg if needed monthly. I am also going to add #10 Klonopin 0.5 mg tabs to be taken as needed. She will continue with behavioral therapy. As for her insomnia, I think the symptom of her anxiety and depression rather than primary insomnia so we are going to avoid Belsomra and Ambien. She can see either myself or Tandy Gaw, PA-C in a month for titration of the dose of Celexa.    ___________________________________________ Ihor Austin. Benjamin Stain, M.D., ABFM., CAQSM. Primary Care and Sports Medicine [facility]  Adjunct Instructor of Family Medicine  [employer]

## 2019-02-27 NOTE — Assessment & Plan Note (Signed)
Discuss has suffered from generalized anxiety disorder for some time now, she also has mild depressive symptoms. She has been on several medications and has seen a psychiatrist in the past. She has been on Zoloft and Lexapro without sufficient or good response, I think the issue may have been insufficient trial periods. She also gets about 4 panic attacks per month, the most recent 1 necessitated a call to 911, ultimately they just talked her down. Today she is having some panic, she is tearful, uneasy, mild shortness of breath. Klonopin 0.5 mg given today orally. I do think she needs a controller agent, we will use Celexa starting at 10 mg daily with a slow up titration to 40 mg if needed monthly. I am also going to add #10 Klonopin 0.5 mg tabs to be taken as needed. She will continue with behavioral therapy. As for her insomnia, I think the symptom of her anxiety and depression rather than primary insomnia so we are going to avoid Belsomra and Ambien. She can see either myself or Tandy Gaw, PA-C in a month for titration of the dose of Celexa.

## 2019-03-05 ENCOUNTER — Ambulatory Visit (INDEPENDENT_AMBULATORY_CARE_PROVIDER_SITE_OTHER): Payer: 59 | Admitting: Physician Assistant

## 2019-03-05 ENCOUNTER — Encounter: Payer: Self-pay | Admitting: Physician Assistant

## 2019-03-05 ENCOUNTER — Other Ambulatory Visit: Payer: Self-pay

## 2019-03-05 VITALS — BP 133/84 | HR 79

## 2019-03-05 DIAGNOSIS — R4587 Impulsiveness: Secondary | ICD-10-CM

## 2019-03-05 DIAGNOSIS — G4700 Insomnia, unspecified: Secondary | ICD-10-CM | POA: Diagnosis not present

## 2019-03-05 DIAGNOSIS — R41840 Attention and concentration deficit: Secondary | ICD-10-CM

## 2019-03-05 DIAGNOSIS — M79605 Pain in left leg: Secondary | ICD-10-CM | POA: Diagnosis not present

## 2019-03-05 DIAGNOSIS — M5136 Other intervertebral disc degeneration, lumbar region: Secondary | ICD-10-CM | POA: Diagnosis not present

## 2019-03-05 NOTE — Progress Notes (Signed)
Subjective:    Patient ID: Courtney Bailey, female    DOB: February 06, 1985, 34 y.o.   MRN: [id number]  HPI  Pt is a 34 yo female with panic attacks, anxiety, depression who presents to the clinic to talk about focus. She is having a lot of problems with focus, completing task, getting out of chair, anxiety, fidgetty. Her coworkers notice but not getting in trouble at work. Does not remember any problems in school but always did struggle.   Headaches have been much better even off topamax. No problems or concerns.   On celexa no real improvement. She continues to not sleep.   She admits that her left anterior leg has been giving her some problems and seems to be a little worse at night. She describes the pain as achy/dull to sharp and to the bone. No known injury. Per patient hx of lumbar DDD in the past. She has done nothing to make better. She was trying to work out more but has pulled back since pain started.   Active Ambulatory Problems    Diagnosis Date Noted  . Class 1 obesity due to excess calories without serious comorbidity with body mass index (BMI) of 33.0 to 33.9 in adult 12/17/2017  . Seborrheic dermatitis of scalp 12/17/2017  . Itchy scalp 12/17/2017  . Eyelid dermatitis, allergic/contact, right 12/17/2017  . Elevated fasting glucose 12/18/2017  . Dyslipidemia (high LDL; low HDL) 12/18/2017  . Prediabetes 12/21/2017  . Perceived hearing changes 01/04/2018  . ETD (Eustachian tube dysfunction), bilateral 01/04/2018  . Depression, recurrent ([place]) 02/19/2018  . Anxiety 02/19/2018  . Insomnia 02/19/2018  . Seasonal allergies 04/30/2018  . Seasonal allergic rhinitis due to pollen 04/30/2018  . Allergic conjunctivitis of both eyes 04/30/2018  . Spotting between menses 05/14/2018  . Breast mass, right 11/22/2018  . Frequent headaches 02/21/2019  . Generalized anxiety disorder 02/27/2019  . Inattention 03/05/2019   Resolved Ambulatory Problems    Diagnosis Date Noted  . No  Resolved Ambulatory Problems   Past Medical History:  Diagnosis Date  . Disc degeneration, lumbar   . Sleep difficulties   . Weight gain        Review of Systems See HPI.     Objective:   Physical Exam Vitals reviewed.  Constitutional:      Appearance: Normal appearance.  HENT:     Head: Normocephalic.  Cardiovascular:     Rate and Rhythm: Normal rate and regular rhythm.  Pulmonary:     Effort: Pulmonary effort is normal.  Musculoskeletal:        General: Normal range of motion.     Comments: Normal ROM at waist.  Negative straight leg test.  No tenderness over lumbar spine.  Normal left hip.  No pain to palpation over left thigh. No pain over greater trochanter to palpation.   Neurological:     General: No focal deficit present.     Mental Status: She is alert.           Assessment & Plan:  .Marland KitchenDestinie was seen today for medication management.  Diagnoses and all orders for this visit:  Inattention -     Ambulatory referral to Psychology  Pain in left leg -     DG Lumbar Spine Complete  DDD (degenerative disc disease), lumbar -     DG Lumbar Spine Complete  Insomnia, unspecified type -     Ambulatory referral to Psychology  Impulsive -     Ambulatory referral to  Psychology     .Marland Kitchen   Adult ADHD Self Report Scale (most recent)    Adult ADHD Self-Report Scale (ASRS-v1.1) Symptom Checklist - 03/05/19 0902      Part A   1. How often do you have trouble wrapping up the final details of a project, once the challenging parts have been done?  (!) Very Often  2. How often do you have difficulty getting things done in order when you have to do a task that requires organization?  (!) Often    3. How often do you have problems remembering appointments or obligations?  (!) Very Often  4. When you have a task that requires a lot of thought, how often do you avoid or delay getting started?  (!) Very Often    5. How often do you fidget or squirm with your hands  or feet when you have to sit down for a long time?  (!) Often  6. How often do you feel overly active and compelled to do things, like you were driven by a motor?  (!) Often      Part B   7. How often do you make careless mistakes when you have to work on a boring or difficult project?  Sometimes  8. How often do you have difficulty keeping your attention when you are doing boring or repetitive work?  (!) Often    9. How often do you have difficulty concentrating on what people say to you, even when they are speaking to you directly?  (!) Often  10. How often do you misplace or have difficulty finding things at home or at work?  (!) Very Often    11. How often are you distracted by activity or noise around you?  (!) Often  12. How often do you leave your seat in meetings or other situations in which you are expected to remain seated?  (!) Very Often    13. How often do you feel restless or fidgety?  (!) Often  14. How often do you have difficulty unwinding and relaxing when you have time to yourself?  (!) Very Often    15. How often do you find yourself talking too much when you are in social situations?  (!) Very Often  16. When you are in a conversation, how often do you find yourself finishing the sentences of the people you are talking to, before they can finish them themselves?  (!) Very Often    17. How often do you have difficulty waiting your turn in situations when turn taking is required?  (!) Often  18. How often do you interrupt others when they are busy?  (!) Very Often      MDQ was negative.   Certainly ADHD is a concern. Do not want to start stimulant for trial use because of her insomnia and recent panic attacks. Sending for formal testing.   Continue on celexa for anxiety. Not been on long enough to see the full benefits.   Get lumbar xray to look at back as cause for leg pain. Per patient been told has some DDD. Could also be some IT band tightness.  Discussed conservative  treatment with tens unit, massage, stretches, icy hot, NSAIDs. Follow up with Dr. Karie Schwalbe.

## 2019-03-10 ENCOUNTER — Encounter: Payer: Self-pay | Admitting: Physician Assistant

## 2019-03-12 ENCOUNTER — Ambulatory Visit (INDEPENDENT_AMBULATORY_CARE_PROVIDER_SITE_OTHER): Payer: 59 | Admitting: Psychology

## 2019-03-12 DIAGNOSIS — F411 Generalized anxiety disorder: Secondary | ICD-10-CM | POA: Diagnosis not present

## 2019-03-14 ENCOUNTER — Other Ambulatory Visit: Payer: Self-pay | Admitting: Physician Assistant

## 2019-03-14 MED ORDER — CITALOPRAM HYDROBROMIDE 20 MG PO TABS: 20.0000 mg | ORAL_TABLET | Freq: Every day | ORAL | 1 refills | Status: DC

## 2019-03-14 NOTE — Progress Notes (Signed)
Pt is responding well to celexa. Noticed an improvement but would like increase. Sent 20mg  for celexa. Follow up in 1 month.

## 2019-03-19 ENCOUNTER — Telehealth: Payer: Self-pay | Admitting: Physician Assistant

## 2019-03-19 ENCOUNTER — Other Ambulatory Visit: Payer: Self-pay

## 2019-03-19 ENCOUNTER — Ambulatory Visit (INDEPENDENT_AMBULATORY_CARE_PROVIDER_SITE_OTHER): Payer: 59

## 2019-03-19 DIAGNOSIS — M79605 Pain in left leg: Secondary | ICD-10-CM

## 2019-03-19 DIAGNOSIS — M5136 Other intervertebral disc degeneration, lumbar region: Secondary | ICD-10-CM

## 2019-03-19 DIAGNOSIS — M545 Low back pain: Secondary | ICD-10-CM | POA: Diagnosis not present

## 2019-03-19 NOTE — Progress Notes (Signed)
Mild age degenerative changes at L3-S1. I do not think this is causing your pain.

## 2019-03-25 ENCOUNTER — Ambulatory Visit (INDEPENDENT_AMBULATORY_CARE_PROVIDER_SITE_OTHER): Payer: 59 | Admitting: Physician Assistant

## 2019-03-25 ENCOUNTER — Ambulatory Visit: Payer: 59 | Admitting: Sports Medicine

## 2019-03-25 ENCOUNTER — Ambulatory Visit (INDEPENDENT_AMBULATORY_CARE_PROVIDER_SITE_OTHER): Payer: 59 | Admitting: Psychology

## 2019-03-25 ENCOUNTER — Other Ambulatory Visit: Payer: Self-pay

## 2019-03-25 ENCOUNTER — Encounter: Payer: Self-pay | Admitting: Physician Assistant

## 2019-03-25 DIAGNOSIS — F430 Acute stress reaction: Secondary | ICD-10-CM

## 2019-03-25 DIAGNOSIS — F411 Generalized anxiety disorder: Secondary | ICD-10-CM | POA: Diagnosis not present

## 2019-03-25 DIAGNOSIS — F4323 Adjustment disorder with mixed anxiety and depressed mood: Secondary | ICD-10-CM | POA: Diagnosis not present

## 2019-03-25 MED ORDER — CLONAZEPAM 0.5 MG PO TABS: 0.5000 mg | ORAL_TABLET | Freq: Every day | ORAL | 0 refills | Status: DC | PRN

## 2019-03-28 NOTE — Progress Notes (Signed)
   Subjective:    Patient ID: Courtney Bailey, female    DOB: Sep 29, 1985, 34 y.o.   MRN: [id number]  HPI Pt is a 34 yo female with anxiety who presents to the clinic with worsening anxiety and panic since the dx of her father with a brain tumor. She is the oldest child and having to take care of a lot things for him. She is very overwhelmed. She is going to a therapist. She just increased dose of celexa. klonapin has been helping a lot but using more than as needed but daily. She only has 2 tablets left.    .. Active Ambulatory Problems    Diagnosis Date Noted  . Class 1 obesity due to excess calories without serious comorbidity with body mass index (BMI) of 33.0 to 33.9 in adult 12/17/2017  . Seborrheic dermatitis of scalp 12/17/2017  . Itchy scalp 12/17/2017  . Eyelid dermatitis, allergic/contact, right 12/17/2017  . Elevated fasting glucose 12/18/2017  . Dyslipidemia (high LDL; low HDL) 12/18/2017  . Prediabetes 12/21/2017  . Perceived hearing changes 01/04/2018  . ETD (Eustachian tube dysfunction), bilateral 01/04/2018  . Depression, recurrent ([facility]) 02/19/2018  . Anxiety 02/19/2018  . Insomnia 02/19/2018  . Seasonal allergies 04/30/2018  . Seasonal allergic rhinitis due to pollen 04/30/2018  . Allergic conjunctivitis of both eyes 04/30/2018  . Spotting between menses 05/14/2018  . Breast mass, right 11/22/2018  . Frequent headaches 02/21/2019  . Generalized anxiety disorder 02/27/2019  . Inattention 03/05/2019   Resolved Ambulatory Problems    Diagnosis Date Noted  . No Resolved Ambulatory Problems   Past Medical History:  Diagnosis Date  . Disc degeneration, lumbar   . Sleep difficulties   . Weight gain       Review of Systems See HPI>     Objective:   Physical Exam Vitals reviewed.  Constitutional:      Appearance: Normal appearance.  HENT:     Head: Normocephalic.  Cardiovascular:     Rate and Rhythm: Normal rate and regular rhythm.  Pulmonary:   Effort: Pulmonary effort is normal.     Breath sounds: Normal breath sounds.  Neurological:     General: No focal deficit present.     Mental Status: She is alert and oriented to person, place, and time.  Psychiatric:     Comments: Tearful and anxiety.            Assessment & Plan:  Marland KitchenMarland KitchenDiagnoses and all orders for this visit:  Adjustment disorder with mixed anxiety and depressed mood  Generalized anxiety disorder -     clonazePAM (KLONOPIN) 0.5 MG tablet; Take 1 tablet (0.5 mg total) by mouth daily as needed for anxiety.  Stress reaction   Continue with counseling.  Continue on 20mg  of celexa.  Increased klonapin to twice a day. Discussed dependency risk. Use as needed.  Discussed ways to help with sleep and anxiety.  Discussed FMLA for work so she can take her father to appts.   Follow up in 4 weeks.   Spent 30 minutes with patient care.

## 2019-04-01 ENCOUNTER — Ambulatory Visit (INDEPENDENT_AMBULATORY_CARE_PROVIDER_SITE_OTHER): Payer: 59 | Admitting: Sports Medicine

## 2019-04-01 ENCOUNTER — Encounter: Payer: Self-pay | Admitting: Sports Medicine

## 2019-04-01 DIAGNOSIS — M5416 Radiculopathy, lumbar region: Secondary | ICD-10-CM

## 2019-04-01 MED ORDER — PREDNISONE 50 MG PO TABS: ORAL_TABLET | ORAL | 0 refills | Status: DC

## 2019-04-01 MED ORDER — GABAPENTIN 300 MG PO CAPS: ORAL_CAPSULE | ORAL | 3 refills | Status: DC

## 2019-04-01 NOTE — Assessment & Plan Note (Signed)
3 months of left-sided lumbar radiculitis, no motor symptoms. No progressive weakness, no constitutional symptoms. She does have degenerative disc disease on her x-rays. We will start conservatively, 5 days of prednisone, formal physical therapy. I am adding a bit of gabapentin at night which will serve to help her sleep, block the radicular burning sensations as well as serve as an anxiolytic. Return to see me in 4 to 6 weeks, MRI for interventional planning if no better.

## 2019-04-01 NOTE — Progress Notes (Signed)
    Procedures performed today:    None.  Independent interpretation of notes and tests performed by another provider:   X-rays personally reviewed, mild DDD at L3-L4 and L5-S1.  Impression and Recommendations:    Left lumbar radiculitis 3 months of left-sided lumbar radiculitis, no motor symptoms. No progressive weakness, no constitutional symptoms. She does have degenerative disc disease on her x-rays. We will start conservatively, 5 days of prednisone, formal physical therapy. I am adding a bit of gabapentin at night which will serve to help her sleep, block the radicular burning sensations as well as serve as an anxiolytic. Return to see me in 4 to 6 weeks, MRI for interventional planning if no better.    ___________________________________________ Ihor Austin. Benjamin Stain, M.D., ABFM., CAQSM. Primary Care and Sports Medicine [facility]  Adjunct Instructor of Family Medicine  [employer]

## 2019-04-01 NOTE — Patient Instructions (Signed)
Lumbosacral Radiculopathy Lumbosacral radiculopathy is a condition that involves the spinal nerves and nerve roots in the low back and bottom of the spine. The condition develops when these nerves and nerve roots move out of place or become inflamed and cause symptoms. What are the causes? This condition may be caused by:  Pressure from a disk that bulges out of place (herniated disk). A disk is a plate of soft cartilage that separates bones in the spine.  Disk changes that occur with age (disk degeneration).  A narrowing of the bones of the lower back (spinal stenosis).  A tumor.  An infection.  An injury that places sudden pressure on the disks that cushion the bones of your lower spine. What increases the risk? You are more likely to develop this condition if:  You are a female who is 30-50 years old.  You are a female who is 50-60 years old.  You use improper technique when lifting things.  You are overweight or live a sedentary lifestyle.  Your work requires frequent lifting.  You smoke.  You do repetitive activities that strain the spine. What are the signs or symptoms? Symptoms of this condition include:  Pain that goes down from your back into your legs (sciatica), usually on one side of the body. This is the most common symptom. The pain may be worse with sitting, coughing, or sneezing.  Pain and numbness in your legs.  Muscle weakness.  Tingling.  Loss of bladder control or bowel control. How is this diagnosed? This condition may be diagnosed based on:  Your symptoms and medical history.  A physical exam. If the pain is lasting, you may have tests, such as:  MRI scan.  X-ray.  CT scan.  A type of X-ray used to examine the spinal canal after injecting a dye into your spine (myelogram).  A test to measure how electrical impulses move through a nerve (nerve conduction study). How is this treated? Treatment may depend on the cause of the condition and  may include:  Working with a physical therapist.  Taking pain medicine.  Applying heat and ice to affected areas.  Doing stretches to improve flexibility.  Doing exercises to strengthen back muscles.  Having chiropractic spinal manipulation.  Using transcutaneous electrical nerve stimulation (TENS) therapy.  Getting a steroid injection in the spine. In some cases, no treatment is needed. If the condition is long-lasting (chronic), or if symptoms are severe, treatment may involve surgery or lifestyle changes, such as following a weight-loss plan. Follow these instructions at home: Activity  Avoid bending and other activities that make the problem worse.  Maintain a proper position when standing or sitting: ? When standing, keep your upper back and neck straight, with your shoulders pulled back. Avoid slouching. ? When sitting, keep your back straight and relax your shoulders. Do not round your shoulders or pull them backward.  Do not sit or stand in one place for long periods of time.  Take brief periods of rest throughout the day. This will reduce your pain. It is usually better to rest by lying down or standing, not sitting.  When you are resting for longer periods, mix in some mild activity or stretching between periods of rest. This will help to prevent stiffness and pain.  Get regular exercise. Ask your health care provider what activities are safe for you. If you were shown how to do any exercises or stretches, do them as directed by your health care provider.  Do   not lift anything that is heavier than 10 lb (4.5 kg) or the limit that you are told by your health care provider. Always use proper lifting technique, which includes: ? Bending your knees. ? Keeping the load close to your body. ? Avoiding twisting. Managing pain  If directed, put ice on the affected area: ? Put ice in a plastic bag. ? Place a towel between your skin and the bag. ? Leave the ice on for 20  minutes, 2-3 times a day.  If directed, apply heat to the affected area as often as told by your health care provider. Use the heat source that your health care provider recommends, such as a moist heat pack or a heating pad. ? Place a towel between your skin and the heat source. ? Leave the heat on for 20-30 minutes. ? Remove the heat if your skin turns bright red. This is especially important if you are unable to feel pain, heat, or cold. You may have a greater risk of getting burned.  Take over-the-counter and prescription medicines only as told by your health care provider. General instructions  Sleep on a firm mattress in a comfortable position. Try lying on your side with your knees slightly bent. If you lie on your back, put a pillow under your knees.  Do not drive or use heavy machinery while taking prescription pain medicine.  If your health care provider prescribed a diet or exercise program, follow it as directed.  Keep all follow-up visits as told by your health care provider. This is important. Contact a health care provider if:  Your pain does not improve over time, even when taking pain medicines. Get help right away if:  You develop severe pain.  Your pain suddenly gets worse.  You develop increasing weakness in your legs.  You lose the ability to control your bladder or bowel.  You have difficulty walking or balancing.  You have a fever. Summary  Lumbosacral radiculopathy is a condition that occurs when the spinal nerves and nerve roots in the lower part of the spine move out of place or become inflamed and cause symptoms.  Symptoms include pain, numbness, and tingling that go down from your back into your legs (sciatica), muscle weakness, and loss of bladder control or bowel control.  If directed, apply ice or heat to the affected area as told by your health care provider.  Follow instructions about activity, rest, and proper lifting technique. This  information is not intended to replace advice given to you by your health care provider. Make sure you discuss any questions you have with your health care provider. Document Revised: 01/04/2017 Document Reviewed: 01/04/2017 Elsevier Patient Education  2020 Elsevier Inc.  

## 2019-04-02 ENCOUNTER — Ambulatory Visit: Payer: 59 | Admitting: Psychology

## 2019-04-07 ENCOUNTER — Ambulatory Visit (INDEPENDENT_AMBULATORY_CARE_PROVIDER_SITE_OTHER): Payer: 59 | Admitting: Psychology

## 2019-04-07 DIAGNOSIS — F411 Generalized anxiety disorder: Secondary | ICD-10-CM

## 2019-04-07 DIAGNOSIS — F410 Panic disorder [episodic paroxysmal anxiety] without agoraphobia: Secondary | ICD-10-CM

## 2019-04-16 ENCOUNTER — Other Ambulatory Visit: Payer: Self-pay | Admitting: Neurology

## 2019-04-16 MED ORDER — CITALOPRAM HYDROBROMIDE 40 MG PO TABS: 40.0000 mg | ORAL_TABLET | Freq: Every day | ORAL | 0 refills | Status: DC

## 2019-04-16 NOTE — Telephone Encounter (Signed)
..   Depression screen [facility] 2/9 09/25/2018 12/17/2017  Decreased Interest 0 0  Down, Depressed, Hopeless 0 0  PHQ - 2 Score 0 0  Altered sleeping 2 1  Tired, decreased energy 0 0  Change in appetite 0 0  Feeling bad or failure about yourself  0 0  Trouble concentrating 0 1  Moving slowly or fidgety/restless 0 0  Suicidal thoughts 0 0  PHQ-9 Score 2 2  Difficult doing work/chores Somewhat difficult Not difficult at all   .Marland Kitchen GAD 7 : Generalized Anxiety Score 09/25/2018 12/17/2017  Nervous, Anxious, on Edge 0 1  Control/stop worrying 0 0  Worry too much - different things 0 0  Trouble relaxing 1 1  Restless 0 0  Easily annoyed or irritable 0 0  Afraid - awful might happen 0 0  Total GAD 7 Score 1 2  Anxiety Difficulty Somewhat difficult Not difficult at all    PHQ9 today was 3 GAD-7 today was 6.  Pt is going through chemo/radiation with her fathers brain tumor.   Increased celexa to 40mg  daily.  Follow up in 3 months.

## 2019-04-17 ENCOUNTER — Ambulatory Visit: Payer: 59 | Admitting: Psychology

## 2019-04-18 ENCOUNTER — Encounter: Payer: Self-pay | Admitting: Physician Assistant

## 2019-04-18 ENCOUNTER — Ambulatory Visit (INDEPENDENT_AMBULATORY_CARE_PROVIDER_SITE_OTHER): Payer: 59 | Admitting: Physician Assistant

## 2019-04-18 ENCOUNTER — Ambulatory Visit: Payer: 59

## 2019-04-18 ENCOUNTER — Other Ambulatory Visit: Payer: Self-pay

## 2019-04-18 ENCOUNTER — Other Ambulatory Visit: Payer: Self-pay | Admitting: Physician Assistant

## 2019-04-18 VITALS — BP 130/68 | HR 79 | Wt 250.0 lb

## 2019-04-18 DIAGNOSIS — M79662 Pain in left lower leg: Secondary | ICD-10-CM

## 2019-04-18 DIAGNOSIS — N926 Irregular menstruation, unspecified: Secondary | ICD-10-CM | POA: Diagnosis not present

## 2019-04-18 DIAGNOSIS — Z8249 Family history of ischemic heart disease and other diseases of the circulatory system: Secondary | ICD-10-CM | POA: Diagnosis not present

## 2019-04-18 DIAGNOSIS — R7989 Other specified abnormal findings of blood chemistry: Secondary | ICD-10-CM

## 2019-04-18 LAB — HCG, QUANTITATIVE, PREGNANCY: HCG, Total, QN: 5276 m[IU]/mL

## 2019-04-18 LAB — D-DIMER, QUANTITATIVE: D-Dimer, Quant: 0.66 mcg/mL FEU — ABNORMAL HIGH (ref <0.50)

## 2019-04-18 NOTE — Progress Notes (Signed)
Subjective:    Patient ID: Courtney Bailey, female    DOB: Dec 07, 1985, 34 y.o.   MRN: [id number]  HPI  Pt is a 34 yo female who presents to the clinic after missed period for 6 days. She is not on OCP. She denies any pregnancy symptoms. She has had some abdominal cramping like she is going to start. She is supposed to get covid vaccine today and took home pregnancy test and was positive. LMP 03/14/2019.  She is concerned about her left calf pain. Her father had a DVT and she wonders if she good. She denies any swelling, pain, warmth, redness. She has left radiculitis ongoing for weeks but was thigh pain not calf pain.   .. Active Ambulatory Problems    Diagnosis Date Noted  . Class 1 obesity due to excess calories without serious comorbidity with body mass index (BMI) of 33.0 to 33.9 in adult 12/17/2017  . Seborrheic dermatitis of scalp 12/17/2017  . Itchy scalp 12/17/2017  . Eyelid dermatitis, allergic/contact, right 12/17/2017  . Elevated fasting glucose 12/18/2017  . Dyslipidemia (high LDL; low HDL) 12/18/2017  . Prediabetes 12/21/2017  . Perceived hearing changes 01/04/2018  . ETD (Eustachian tube dysfunction), bilateral 01/04/2018  . Depression, recurrent ([place]) 02/19/2018  . Anxiety 02/19/2018  . Insomnia 02/19/2018  . Seasonal allergies 04/30/2018  . Seasonal allergic rhinitis due to pollen 04/30/2018  . Allergic conjunctivitis of both eyes 04/30/2018  . Spotting between menses 05/14/2018  . Breast mass, right 11/22/2018  . Frequent headaches 02/21/2019  . Generalized anxiety disorder 02/27/2019  . Inattention 03/05/2019  . Left lumbar radiculitis 04/01/2019  . Family history of blood clots 04/18/2019  . Pain of left calf 04/18/2019   Resolved Ambulatory Problems    Diagnosis Date Noted  . No Resolved Ambulatory Problems   Past Medical History:  Diagnosis Date  . Disc degeneration, lumbar   . Sleep difficulties   . Weight gain          Review of Systems   All other systems reviewed and are negative.      Objective:   Physical Exam Vitals reviewed.  Constitutional:      Appearance: Normal appearance.  Cardiovascular:     Rate and Rhythm: Normal rate and regular rhythm.     Pulses: Normal pulses.  Pulmonary:     Effort: Pulmonary effort is normal.     Breath sounds: Normal breath sounds.  Musculoskeletal:     Comments: No calf swelling, redness, tenderness, or warmth.  Negative homans sign.   Neurological:     General: No focal deficit present.     Mental Status: She is alert and oriented to person, place, and time.  Psychiatric:        Mood and Affect: Mood normal.           Assessment & Plan:  .Marland KitchenNeena was seen today for medication refill.  Diagnoses and all orders for this visit:  Missed period -     B-HCG Quant  Pain of left calf -     D-dimer, quantitative (not at [facility])  Family history of blood clots   Low suspicion for DVT however father did have recent DVT but he has a malignancy. Will get d-dimer. Likely pain is her left leg radiculitis.   Will get blood pregnancy test.  Continue on prenatals.  Do not use klonapin or gabapentin. Continue on celexa.  Will call with results.   Do not get covid vaccine until pregnancy  confirmed.

## 2019-04-18 NOTE — Progress Notes (Signed)
Pt let know that doppler is ordered to be done stat today.

## 2019-04-18 NOTE — Progress Notes (Signed)
D-dimer was elevated. Need to get doppler. Go downstairs.

## 2019-04-18 NOTE — Progress Notes (Signed)
No DVT

## 2019-04-21 ENCOUNTER — Telehealth: Payer: Self-pay | Admitting: *Deleted

## 2019-04-21 NOTE — Telephone Encounter (Signed)
Left patient a message to call and reschedule New OB appointment due to the RN not being here on 05/23/19, office can reschedule for 05/22/19 or 05/27/19. Patient can not come on Mondays.

## 2019-05-01 ENCOUNTER — Other Ambulatory Visit: Payer: Self-pay

## 2019-05-01 ENCOUNTER — Ambulatory Visit (INDEPENDENT_AMBULATORY_CARE_PROVIDER_SITE_OTHER): Payer: 59 | Admitting: Family Medicine

## 2019-05-01 ENCOUNTER — Encounter: Payer: Self-pay | Admitting: Family Medicine

## 2019-05-01 VITALS — BP 123/62 | HR 70 | Ht 67.0 in | Wt 251.0 lb

## 2019-05-01 DIAGNOSIS — R0789 Other chest pain: Secondary | ICD-10-CM | POA: Diagnosis not present

## 2019-05-01 NOTE — Progress Notes (Signed)
Established Patient Office Visit  Subjective:  Patient ID: Courtney Bailey, female    DOB: 16-May-1985  Age: 34 y.o. MRN: [id number]  CC:  Chief Complaint  Patient presents with  . atypical chest pain    HPI Courtney Bailey presents for intermittent CP that started about a week ago. Noticed it mostly while lying down.  She was able to rub her chest to make it go away.  Occ assoc with SOB.  Can last 2 minutes.  Last episode 2 days ago.  Says one episode felt a little SOB.  No nausea or vomiting.  No sore throat.  She has not had a little discomfort in her left ear.  No cough.  Not noticed any chest discomfort with significant activity.  She was recently seen for leg pain and had is mildly elevated D-dimer at 0.6 but had a negative lower extremity Doppler.  He does have a prior history of iron deficiency when she was vegan.  She also has a sister who was diagnosed with hypothyroidism around age 48.  LMP was in December and she is currently pregnant.  She is about [redacted] weeks along. Has first OB appt in about 3 weeks. They don't want to see her until 10 weeks. She denies any HA or nausea. Has hd some fatigue.    Past Medical History:  Diagnosis Date  . Disc degeneration, lumbar   . Sleep difficulties   . Weight gain     Past Surgical History:  Procedure Laterality Date  . COLONOSCOPY      Family History  Problem Relation Age of Onset  . Hypothyroidism Sister   . Breast cancer Neg Hx     Social History   Socioeconomic History  . Marital status: Single    Spouse name: Not on file  . Number of children: Not on file  . Years of education: Not on file  . Highest education level: Not on file  Occupational History  . Not on file  Tobacco Use  . Smoking status: Never Smoker  . Smokeless tobacco: Never Used  Substance and Sexual Activity  . Alcohol use: Yes    Comment: occ  . Drug use: Never  . Sexual activity: Yes    Birth control/protection: None  Other Topics Concern  .  Not on file  Social History Narrative  . Not on file   Social Determinants of Health   Financial Resource Strain:   . Difficulty of Paying Living Expenses:   Food Insecurity:   . Worried About Programme researcher, broadcasting/film/video in the Last Year:   . Barista in the Last Year:   Transportation Needs:   . Freight forwarder (Medical):   Marland Kitchen Lack of Transportation (Non-Medical):   Physical Activity:   . Days of Exercise per Week:   . Minutes of Exercise per Session:   Stress:   . Feeling of Stress :   Social Connections:   . Frequency of Communication with Friends and Family:   . Frequency of Social Gatherings with Friends and Family:   . Attends Religious Services:   . Active Member of Clubs or Organizations:   . Attends Banker Meetings:   Marland Kitchen Marital Status:   Intimate Partner Violence:   . Fear of Current or Ex-Partner:   . Emotionally Abused:   Marland Kitchen Physically Abused:   . Sexually Abused:     Outpatient Medications Prior to Visit  Medication Sig Dispense Refill  .  albuterol (VENTOLIN HFA) 108 (90 Base) MCG/ACT inhaler Inhale 2 puffs into the lungs every 4 (four) hours as needed for wheezing or shortness of breath. 18 g 0  . citalopram (CELEXA) 40 MG tablet Take 1 tablet (40 mg total) by mouth daily. (Patient taking differently: Take 20 mg by mouth daily. ) 90 tablet 0  . ketoconazole (NIZORAL) 2 % shampoo Apply 1 application topically 2 (two) times a week. 120 mL 1  . clonazePAM (KLONOPIN) 0.5 MG tablet Take 1 tablet (0.5 mg total) by mouth daily as needed for anxiety. 60 tablet 0  . gabapentin (NEURONTIN) 300 MG capsule One tab PO qHS for a week, then BID for a week, then TID. May double weekly to a max of 3,600mg /day 90 capsule 3  . predniSONE (DELTASONE) 50 MG tablet One tab PO daily for 5 days. 5 tablet 0   No facility-administered medications prior to visit.    Allergies  Allergen Reactions  . Penicillin G Other (See Comments)    Does not know reaction.  Occurred in early childhood Does not know reaction. Occurred in early childhood   . Trazodone Other (See Comments)    Nightmares  . Escitalopram Oxalate Anxiety    Worsening anxiety symptoms.    ROS Review of Systems    Objective:    Physical Exam  Constitutional: She is oriented to person, place, and time. She appears well-developed and well-nourished.  HENT:  Head: Normocephalic and atraumatic.  Eyes: Conjunctivae are normal.  Cardiovascular: Normal rate, regular rhythm and normal heart sounds.  Pulmonary/Chest: Effort normal and breath sounds normal.  Musculoskeletal:     Cervical back: Neck supple.  Neurological: She is alert and oriented to person, place, and time.  Skin: Skin is warm and dry.  Psychiatric: She has a normal mood and affect. Her behavior is normal.    BP 123/62   Pulse 70   Ht 5\' 7"  (1.702 m)   Wt 251 lb (113.9 kg)   LMP 03/14/2019 (Exact Date)   SpO2 99%   BMI 39.31 kg/m  Wt Readings from Last 3 Encounters:  05/01/19 251 lb (113.9 kg)  04/18/19 250 lb (113.4 kg)  02/19/19 215 lb (97.5 kg)     Health Maintenance Due  Topic Date Due  . HIV Screening  Never done  . PAP SMEAR-Modifier  03/20/2019    There are no preventive care reminders to display for this patient.  Lab Results  Component Value Date   TSH 1.98 09/18/2018   Lab Results  Component Value Date   WBC 5.5 09/18/2018   HGB 13.2 09/18/2018   HCT 40.5 09/18/2018   MCV 90.4 09/18/2018   PLT 298 09/18/2018   Lab Results  Component Value Date   NA 137 09/18/2018   K 4.2 09/18/2018   CO2 21 09/18/2018   GLUCOSE 113 (H) 09/18/2018   BUN 8 09/18/2018   CREATININE 0.80 09/18/2018   BILITOT 0.5 09/18/2018   AST 14 09/18/2018   ALT 12 09/18/2018   PROT 7.2 09/18/2018   CALCIUM 9.5 09/18/2018   Lab Results  Component Value Date   CHOL 219 (H) 09/18/2018   Lab Results  Component Value Date   HDL 43 (L) 09/18/2018   Lab Results  Component Value Date   LDLCALC 153  (H) 09/18/2018   Lab Results  Component Value Date   TRIG 116 09/18/2018   Lab Results  Component Value Date   CHOLHDL 5.1 (H) 09/18/2018   Lab Results  Component Value Date   HGBA1C 5.9 (H) 09/18/2018      Assessment & Plan:   Problem List Items Addressed This Visit    None    Visit Diagnoses    Atypical chest pain    -  Primary   Relevant Orders   COMPLETE METABOLIC PANEL WITH GFR   CBC with Differential/Platelet   TSH   CK (Creatine Kinase)   Troponin I     Atypical chest pain-reassuring in that it happens mostly at rest and it lasts a couple minutes and goes away which is rubbing the chest.  She denies any increased symptoms in reflux or GERD.  Gave reassurance.  We will do some additional work-up to rule out anemia and thyroid disorder and electrolyte disturbance.  EKG as below.  She does have an abnormality but unclear if this is new or old but she is very low risk for cardiac disease and the chest pain is not presenting with typical cardiac chest pain.  Lungs are clear so unlikely to be pulmonary.  EKG today shows rate of 70 bpm, normal sinus rhythm with what looks like possible old anterior infarct in leads V2 and V3.  Do not have an old EKG for comparison in fact she thinks this may have be her first 1.  No orders of the defined types were placed in this encounter.   Follow-up: Return if symptoms worsen or fail to improve.    Nani Gasser, MD

## 2019-05-02 LAB — CBC WITH DIFFERENTIAL/PLATELET
Absolute Monocytes: 462 cells/uL (ref 200–950)
Basophils Absolute: 33 cells/uL (ref 0–200)
Basophils Relative: 0.5 %
Eosinophils Absolute: 163 cells/uL (ref 15–500)
Eosinophils Relative: 2.5 %
HCT: 38.4 % (ref 35.0–45.0)
Hemoglobin: 12.5 g/dL (ref 11.7–15.5)
Lymphs Abs: 1417 cells/uL (ref 850–3900)
MCH: 29 pg (ref 27.0–33.0)
MCHC: 32.6 g/dL (ref 32.0–36.0)
MCV: 89.1 fL (ref 80.0–100.0)
MPV: 10.1 fL (ref 7.5–12.5)
Monocytes Relative: 7.1 %
Neutro Abs: 4427 cells/uL (ref 1500–7800)
Neutrophils Relative %: 68.1 %
Platelets: 351 10*3/uL (ref 140–400)
RBC: 4.31 10*6/uL (ref 3.80–5.10)
RDW: 12.9 % (ref 11.0–15.0)
Total Lymphocyte: 21.8 %
WBC: 6.5 10*3/uL (ref 3.8–10.8)

## 2019-05-02 LAB — COMPLETE METABOLIC PANEL WITH GFR
AG Ratio: 1.5 (calc) (ref 1.0–2.5)
ALT: 13 U/L (ref 6–29)
AST: 12 U/L (ref 10–30)
Albumin: 4.1 g/dL (ref 3.6–5.1)
Alkaline phosphatase (APISO): 61 U/L (ref 31–125)
BUN: 10 mg/dL (ref 7–25)
CO2: 26 mmol/L (ref 20–32)
Calcium: 9.2 mg/dL (ref 8.6–10.2)
Chloride: 103 mmol/L (ref 98–110)
Creat: 0.63 mg/dL (ref 0.50–1.10)
GFR, Est African American: 139 mL/min/{1.73_m2} (ref >=60)
GFR, Est Non African American: 120 mL/min/{1.73_m2} (ref >=60)
Globulin: 2.7 g/dL (calc) (ref 1.9–3.7)
Glucose, Bld: 94 mg/dL (ref 65–139)
Potassium: 4.1 mmol/L (ref 3.5–5.3)
Sodium: 136 mmol/L (ref 135–146)
Total Bilirubin: 0.3 mg/dL (ref 0.2–1.2)
Total Protein: 6.8 g/dL (ref 6.1–8.1)

## 2019-05-02 LAB — TSH: TSH: 1.99 mIU/L

## 2019-05-02 LAB — TROPONIN I: Troponin I: <0.01 ng/mL (ref <=0.0)

## 2019-05-02 LAB — CK: Total CK: 36 U/L (ref 29–143)

## 2019-05-07 ENCOUNTER — Ambulatory Visit: Payer: 59 | Admitting: Psychology

## 2019-05-15 NOTE — Addendum Note (Signed)
Addended by: Chalmers Cater on: 05/15/2019 08:18 AM   Modules accepted: Orders

## 2019-05-22 ENCOUNTER — Encounter: Payer: 59 | Admitting: Obstetrics and Gynecology

## 2019-05-23 ENCOUNTER — Encounter: Payer: 59 | Admitting: Women's Health

## 2019-05-28 ENCOUNTER — Other Ambulatory Visit: Payer: 59

## 2019-05-28 ENCOUNTER — Ambulatory Visit: Payer: 59 | Admitting: Psychology

## 2019-05-28 DIAGNOSIS — O3680X0 Pregnancy with inconclusive fetal viability, not applicable or unspecified: Secondary | ICD-10-CM | POA: Diagnosis not present

## 2019-05-28 DIAGNOSIS — N926 Irregular menstruation, unspecified: Secondary | ICD-10-CM | POA: Diagnosis not present

## 2019-05-28 DIAGNOSIS — Z3401 Encounter for supervision of normal first pregnancy, first trimester: Secondary | ICD-10-CM | POA: Diagnosis not present

## 2019-06-16 ENCOUNTER — Other Ambulatory Visit: Payer: Self-pay | Admitting: Neurology

## 2019-06-16 MED ORDER — CITALOPRAM HYDROBROMIDE 20 MG PO TABS: 20.0000 mg | ORAL_TABLET | Freq: Every day | ORAL | 1 refills | Status: DC

## 2019-06-18 DIAGNOSIS — Z124 Encounter for screening for malignant neoplasm of cervix: Secondary | ICD-10-CM | POA: Diagnosis not present

## 2019-06-18 DIAGNOSIS — Z6841 Body Mass Index (BMI) 40.0 and over, adult: Secondary | ICD-10-CM | POA: Diagnosis not present

## 2019-06-18 DIAGNOSIS — Z3400 Encounter for supervision of normal first pregnancy, unspecified trimester: Secondary | ICD-10-CM | POA: Diagnosis not present

## 2019-06-18 DIAGNOSIS — Z1151 Encounter for screening for human papillomavirus (HPV): Secondary | ICD-10-CM | POA: Diagnosis not present

## 2019-06-18 LAB — HM PAP SMEAR: HM Pap smear: NEGATIVE

## 2019-07-07 DIAGNOSIS — R7303 Prediabetes: Secondary | ICD-10-CM | POA: Diagnosis not present

## 2019-07-07 DIAGNOSIS — Z3A16 16 weeks gestation of pregnancy: Secondary | ICD-10-CM | POA: Diagnosis not present

## 2019-07-07 DIAGNOSIS — O99810 Abnormal glucose complicating pregnancy: Secondary | ICD-10-CM | POA: Diagnosis not present

## 2019-07-16 DIAGNOSIS — R7301 Impaired fasting glucose: Secondary | ICD-10-CM | POA: Diagnosis not present

## 2019-07-16 DIAGNOSIS — O99810 Abnormal glucose complicating pregnancy: Secondary | ICD-10-CM | POA: Diagnosis not present

## 2019-07-16 DIAGNOSIS — Z3A17 17 weeks gestation of pregnancy: Secondary | ICD-10-CM | POA: Diagnosis not present

## 2019-07-22 ENCOUNTER — Ambulatory Visit (INDEPENDENT_AMBULATORY_CARE_PROVIDER_SITE_OTHER): Payer: 59 | Admitting: Physician Assistant

## 2019-07-22 ENCOUNTER — Encounter: Payer: Self-pay | Admitting: Physician Assistant

## 2019-07-22 VITALS — BP 127/74 | HR 98 | Ht 67.0 in | Wt 252.0 lb

## 2019-07-22 DIAGNOSIS — O99810 Abnormal glucose complicating pregnancy: Secondary | ICD-10-CM | POA: Diagnosis not present

## 2019-07-22 MED ORDER — FREESTYLE LIBRE 14 DAY READER DEVI: 1.0000 | 11 refills | Status: DC

## 2019-07-22 MED ORDER — FREESTYLE LIBRE 14 DAY SENSOR MISC: 1.0000 | 11 refills | Status: DC

## 2019-07-22 NOTE — Patient Instructions (Signed)
Gestational Diabetes Mellitus, Diagnosis Gestational diabetes (gestational diabetes mellitus) is a temporary form of diabetes that some women develop during pregnancy. It usually occurs around weeks 24-28 of pregnancy, and it goes away after delivery. Hormonal changes during pregnancy can interfere with insulin production and function, which may result in one or both of these problems:  The pancreas does not make enough of a hormone called insulin.  Cells in the body do not respond properly to insulin that the body makes (insulin resistance). Normally, insulin allows blood sugar (glucose) to enter cells in the body. The cells use glucose for energy. Insulin resistance or lack of insulin causes excess glucose to build up in the blood instead of going into cells. As a result, high blood glucose (hyperglycemia) develops. If gestational diabetes is treated, it is not likely to cause problems. If it is not controlled with treatment, it may cause problems during labor and delivery, and some of those problems can be harmful to the unborn baby (fetus) and the mother. Women who get gestational diabetes are more likely to develop it if they get pregnant again, and they are more likely to develop type 2 diabetes in the future. What increases the risk? This condition may be more likely to develop in pregnant women who:  Are older than age 25 during pregnancy.  Have a family history of diabetes.  Are overweight.  Had gestational diabetes in the past.  Have polycystic ovary syndrome (PCOS).  Are pregnant with twins or multiples.  Are of American-Indian, African-American, Hispanic/Latino, or Asian/Pacific Islander descent. What are the signs or symptoms? Most women do not notice symptoms of gestational diabetes because the symptoms are similar to normal symptoms of pregnancy. Symptoms of gestational diabetes may include:  Increased thirst (polydipsia).  Increased hunger(polyphagia).  Increased  urination (polyuria). How is this diagnosed? This condition may be diagnosed based on your blood glucose level, which may be checked with one or more of the following blood tests:  A fasting blood glucose (FBG) test. You will not be allowed to eat (you will fast) for 8 hours or longer before a blood sample is taken.  A random blood glucose test. This checks your blood glucose at any time of day regardless of when you ate.  An oral glucose tolerance test (OGTT). This is usually done during weeks 24-28 of pregnancy. ? For this test, you will have an FBG test done. Then, you will drink a beverage that contains glucose. Your blood glucose will be tested again one hour after you drink the glucose beverage (1-hour OGTT). ? If the 1-hour OGTT result is at or above 140 mg/dL (7.8 mmol/L), you will repeat the OGTT. This time, your blood glucose will be tested 3 hours after you drink the glucose beverage (3-hour OGTT). If you have risk factors, you may be screened for undiagnosed type 2 diabetes at your first health care visit during your pregnancy (prenatal visit). How is this treated?     Your treatment may be managed by a specialist called an endocrinologist. This condition is treated by following instructions from your health care provider about:  Eating a healthy diet and getting more physical activity. These changes are the most important ways to manage gestational diabetes.  Checking your blood glucose. Do this as often as told.  Taking diabetes medicines or insulin every day. These will only be prescribed if they are needed. ? If you use insulin, you may need to adjust your dosage based on how physically active   you are and what foods you eat. Your health care provider will tell you how to do this. Your health care provider will set treatment goals for you based on the stage of your pregnancy and any other medical conditions you have. Generally, the goal of treatment is to maintain the following  blood glucose levels during pregnancy:  Before meals (preprandial): at or below 95 mg/dL (5.3 mmol/L).  After meals (postprandial): ? One hour after a meal: at or below 140 mg/dL (7.8 mmol/L). ? Two hours after a meal: at or below 120 mg/dL (6.7 mmol/L).  A1c (hemoglobin A1c) level: 6-6.5%. Follow these instructions at home: Questions to ask your health care provider  Consider asking the following questions: ? Do I need to meet with a diabetes educator? ? What equipment will I need to manage my diabetes at home? ? What diabetes medicines do I need, and when should I take them? ? How often do I need to check my blood glucose? ? What number can I call if I have questions? ? When is my next appointment? General instructions  Take over-the-counter and prescription medicines only as told by your health care provider.  Manage your weight gain during pregnancy. The amount of weight that you are expected to gain depends on your pre-pregnancy BMI (body mass index).  Keep all follow-up visits as told by your health care provider. This is important.  For more information about diabetes, visit: ? American Diabetes Association (ADA): www.diabetes.org ? American Association of Diabetes Educators (AADE): www.diabeteseducator.org Contact a health care provider if:  Your blood glucose level is at or above 240 mg/dL (13.3 mmol/L).  Your blood glucose level is at or above 200 mg/dL (11.1 mmol/L) and you have ketones in your urine.  You have been sick or have had a fever for 2 days or longer and you are not getting better.  You have any of the following problems for more than 6 hours: ? You cannot eat or drink. ? You have nausea and vomiting. ? You have diarrhea. Get help right away if:  Your blood glucose is lower than 54 mg/dL (3 mmol/L).  You become confused or you have trouble thinking clearly.  You have difficulty breathing.  You have moderate or large ketone levels in your  urine.  Your baby is moving around less than usual.  You develop unusual discharge or bleeding from your vagina.  You start having contractions early (prematurely). Contractions may feel like a tightening in your lower abdomen. Summary  Gestational diabetes (gestational diabetes mellitus) is a temporary form of diabetes that some women develop during pregnancy. It usually occurs around weeks 24-28 of pregnancy, and it goes away after delivery.  This condition is treated by making diet and lifestyle changes and taking diabetes medicines or insulin, if needed.  Women who get gestational diabetes are more likely to develop it if they get pregnant again, and they are more likely to develop type 2 diabetes in the future. This information is not intended to replace advice given to you by your health care provider. Make sure you discuss any questions you have with your health care provider. Document Revised: 02/22/2017 Document Reviewed: 02/19/2015 Elsevier Patient Education  2020 Elsevier Inc.  

## 2019-07-22 NOTE — Progress Notes (Addendum)
Subjective:    Patient ID: Courtney Bailey, female    DOB: April 29, 1985, 34 y.o.   MRN: [id number]  HPI  Sylvia Helms is 34 year old female G1P0 approximately [redacted]w[redacted]d  presenting for diabetes counseling.   She states that her nutritionist mentioned she may need to start insulin soon and she wanted a second opinion due to her sugars not being too high. She is currently managed with diet modification and exercise. Her fasting blood glucose levels have been ranging from the 60's to 110's. She states she has lost about 6 pounds since making changes.   She admits to a hypoglycemia episode on 6/21 where her level was reported to be 22. She admits she was mildly disoriented and very dizzy. She ate some M&Ms  and drank some juice and rechecked her sugar 15 minutes later and it was 85.   She denies any additional hypoglycemia episodes. Denies vision changes, headaches, dizziness, syncope/presyncope, chest pain, palpitations, abdominal pain. She denies any vaginal bleeding, leaking, or cramping.   .. Active Ambulatory Problems    Diagnosis Date Noted  . Class 1 obesity due to excess calories without serious comorbidity with body mass index (BMI) of 33.0 to 33.9 in adult 12/17/2017  . Seborrheic dermatitis of scalp 12/17/2017  . Itchy scalp 12/17/2017  . Eyelid dermatitis, allergic/contact, right 12/17/2017  . Elevated fasting glucose 12/18/2017  . Dyslipidemia (high LDL; low HDL) 12/18/2017  . Prediabetes 12/21/2017  . Perceived hearing changes 01/04/2018  . ETD (Eustachian tube dysfunction), bilateral 01/04/2018  . Depression, recurrent ([place]) 02/19/2018  . Anxiety 02/19/2018  . Insomnia 02/19/2018  . Seasonal allergies 04/30/2018  . Seasonal allergic rhinitis due to pollen 04/30/2018  . Allergic conjunctivitis of both eyes 04/30/2018  . Spotting between menses 05/14/2018  . Breast mass, right 11/22/2018  . Frequent headaches 02/21/2019  . Generalized anxiety disorder 02/27/2019  .  Inattention 03/05/2019  . Left lumbar radiculitis 04/01/2019  . Family history of blood clots 04/18/2019  . Pain of left calf 04/18/2019   Resolved Ambulatory Problems    Diagnosis Date Noted  . No Resolved Ambulatory Problems   Past Medical History:  Diagnosis Date  . Disc degeneration, lumbar   . Sleep difficulties   . Weight gain       Review of Systems  Constitutional: Negative.   HENT: Negative.   Eyes: Negative.   Respiratory: Negative.   Cardiovascular: Negative.   Gastrointestinal: Negative.   Genitourinary: Negative.   Musculoskeletal: Negative.   Neurological: Negative.   Psychiatric/Behavioral: Negative.        Objective:   Physical Exam Constitutional:      Appearance: Normal appearance.  HENT:     Head: Normocephalic and atraumatic.  Eyes:     Extraocular Movements: Extraocular movements intact.     Conjunctiva/sclera: Conjunctivae normal.     Pupils: Pupils are equal, round, and reactive to light.  Cardiovascular:     Rate and Rhythm: Normal rate and regular rhythm.     Pulses: Normal pulses.     Heart sounds: Normal heart sounds.  Pulmonary:     Effort: Pulmonary effort is normal.  Musculoskeletal:        General: Normal range of motion.     Cervical back: Normal range of motion.  Skin:    General: Skin is warm and dry.  Neurological:     General: No focal deficit present.     Mental Status: She is alert and oriented to person, place, and  time.  Psychiatric:        Mood and Affect: Mood normal.        Behavior: Behavior normal.        Assessment & Plan:  .Marland KitchenAzriel was seen today for blood sugar problem.  Diagnoses and all orders for this visit:  Abnormal glucose in pregnancy, antepartum -     Continuous Blood Gluc Receiver (FREESTYLE LIBRE 14 DAY READER) DEVI; 1 applicator by Does not apply route every 14 (fourteen) days. -     Continuous Blood Gluc Sensor (FREESTYLE LIBRE 14 DAY SENSOR) MISC; 1 Device by Does not apply route every  14 (fourteen) days.    - Continue diet modifications and exercise.  - Fasting sugars are fantastic, ranging from 60s-110.  - not sure if 22 was a meter error but likely her sugars were low since after M and M's was 80s.  - Continue eat healthy meals and frequent snacks and hydrate properly.  - Continue monitoring levels after meals. -will see if freestyle Josephine Igo is covered so she can check sugars more frequently without the needle sticks.   I do not see the need to start Metformin or Insulin at this time. She is well controlled with lifestyle changes.   She should have glucose tolerance test coming up at 24 weeks.   Discussed alarming symptoms of hypoglycemia and appropriate foods/drinks to consume during episodes.   Spent 30 minutes with patient.   Marland KitchenHarlon Flor PA-C, have reviewed and agree with the above documentation in it's entirety.

## 2019-07-23 ENCOUNTER — Ambulatory Visit: Payer: 59 | Admitting: Psychology

## 2019-07-24 DIAGNOSIS — R7303 Prediabetes: Secondary | ICD-10-CM | POA: Diagnosis not present

## 2019-07-24 DIAGNOSIS — O99212 Obesity complicating pregnancy, second trimester: Secondary | ICD-10-CM | POA: Diagnosis not present

## 2019-07-24 DIAGNOSIS — Z3A19 19 weeks gestation of pregnancy: Secondary | ICD-10-CM | POA: Diagnosis not present

## 2019-07-31 DIAGNOSIS — Z3A19 19 weeks gestation of pregnancy: Secondary | ICD-10-CM | POA: Diagnosis not present

## 2019-07-31 DIAGNOSIS — O99810 Abnormal glucose complicating pregnancy: Secondary | ICD-10-CM | POA: Diagnosis not present

## 2019-08-13 DIAGNOSIS — Z3A21 21 weeks gestation of pregnancy: Secondary | ICD-10-CM | POA: Diagnosis not present

## 2019-08-13 DIAGNOSIS — O99810 Abnormal glucose complicating pregnancy: Secondary | ICD-10-CM | POA: Diagnosis not present

## 2019-08-28 DIAGNOSIS — O99212 Obesity complicating pregnancy, second trimester: Secondary | ICD-10-CM | POA: Diagnosis not present

## 2019-08-28 DIAGNOSIS — Z362 Encounter for other antenatal screening follow-up: Secondary | ICD-10-CM | POA: Diagnosis not present

## 2019-09-11 DIAGNOSIS — Z3400 Encounter for supervision of normal first pregnancy, unspecified trimester: Secondary | ICD-10-CM | POA: Diagnosis not present

## 2019-09-11 LAB — HM HIV SCREENING LAB: HM HIV Screening: NEGATIVE

## 2019-09-19 ENCOUNTER — Encounter: Payer: Self-pay | Admitting: Physician Assistant

## 2019-09-19 ENCOUNTER — Ambulatory Visit (INDEPENDENT_AMBULATORY_CARE_PROVIDER_SITE_OTHER): Payer: 59 | Admitting: Physician Assistant

## 2019-09-19 VITALS — BP 138/79 | HR 106 | Temp 97.9°F | Ht 67.0 in | Wt 262.0 lb

## 2019-09-19 DIAGNOSIS — R350 Frequency of micturition: Secondary | ICD-10-CM

## 2019-09-19 DIAGNOSIS — D508 Other iron deficiency anemias: Secondary | ICD-10-CM | POA: Diagnosis not present

## 2019-09-19 DIAGNOSIS — Z3A27 27 weeks gestation of pregnancy: Secondary | ICD-10-CM | POA: Diagnosis not present

## 2019-09-19 DIAGNOSIS — R7303 Prediabetes: Secondary | ICD-10-CM | POA: Diagnosis not present

## 2019-09-19 LAB — POCT URINALYSIS DIP (CLINITEK)
Bilirubin, UA: NEGATIVE
Blood, UA: NEGATIVE
Glucose, UA: NEGATIVE mg/dL
Ketones, POC UA: NEGATIVE mg/dL
Leukocytes, UA: NEGATIVE
Nitrite, UA: NEGATIVE
POC PROTEIN,UA: NEGATIVE
Spec Grav, UA: 1.025 (ref 1.010–1.025)
Urobilinogen, UA: 0.2 E.U./dL
pH, UA: 7 (ref 5.0–8.0)

## 2019-09-19 NOTE — Patient Instructions (Signed)

## 2019-09-19 NOTE — Progress Notes (Signed)
Subjective:    Patient ID: Courtney Bailey, female    DOB: 1986/01/09, 34 y.o.   MRN: [id number]  HPI  Pt is [redacted] weeks pregnant with pre-diabetes and anemia who presents to the clinic for urinary frequency. She has had this even before pregnancy but seems to be getting worse. She is worried it could be something else like amniotic fluid or diabetes. Next OB appt is September 3rd. She urinates 4-6 times at night and all throughout the day. Last a1c was 8/19 and 5.9. she urinated a lot before pregnancy as well. She is having some leakage now too. No fever, chills, abdominal or flank pain. No nausea, vomiting, diarrhea or constipation.   Just start iron supplement for anemia.   .. Active Ambulatory Problems    Diagnosis Date Noted  . Class 1 obesity due to excess calories without serious comorbidity with body mass index (BMI) of 33.0 to 33.9 in adult 12/17/2017  . Seborrheic dermatitis of scalp 12/17/2017  . Itchy scalp 12/17/2017  . Eyelid dermatitis, allergic/contact, right 12/17/2017  . Elevated fasting glucose 12/18/2017  . Dyslipidemia (high LDL; low HDL) 12/18/2017  . Pre-diabetes 12/21/2017  . Perceived hearing changes 01/04/2018  . ETD (Eustachian tube dysfunction), bilateral 01/04/2018  . Depression, recurrent ([facility]) 02/19/2018  . Anxiety 02/19/2018  . Insomnia 02/19/2018  . Seasonal allergies 04/30/2018  . Seasonal allergic rhinitis due to pollen 04/30/2018  . Allergic conjunctivitis of both eyes 04/30/2018  . Spotting between menses 05/14/2018  . Breast mass, right 11/22/2018  . Frequent headaches 02/21/2019  . Generalized anxiety disorder 02/27/2019  . Inattention 03/05/2019  . Left lumbar radiculitis 04/01/2019  . Family history of blood clots 04/18/2019  . Pain of left calf 04/18/2019  . Abnormal glucose in pregnancy, antepartum 07/22/2019  . [redacted] weeks gestation of pregnancy 09/19/2019  . Urinary frequency 09/19/2019  . Iron deficiency anemia secondary to inadequate  dietary iron intake 09/19/2019   Resolved Ambulatory Problems    Diagnosis Date Noted  . No Resolved Ambulatory Problems   Past Medical History:  Diagnosis Date  . Disc degeneration, lumbar   . Sleep difficulties   . Weight gain      Review of Systems See HPI.     Objective:   Physical Exam Vitals reviewed.  Constitutional:      Appearance: Normal appearance. She is obese.  Cardiovascular:     Rate and Rhythm: Normal rate.  Pulmonary:     Effort: Pulmonary effort is normal.  Abdominal:     Tenderness: There is no abdominal tenderness. There is no right CVA tenderness or left CVA tenderness.  Neurological:     General: No focal deficit present.     Mental Status: She is alert and oriented to person, place, and time.  Psychiatric:        Mood and Affect: Mood normal.           Assessment & Plan:  .Marland KitchenLoree was seen today for urinary frequency and dizziness.  Diagnoses and all orders for this visit:  Urinary frequency -     POCT URINALYSIS DIP (CLINITEK)  [redacted] weeks gestation of pregnancy  Pre-diabetes  Iron deficiency anemia secondary to inadequate dietary iron intake    Results for orders placed or performed in visit on 09/19/19  POCT URINALYSIS DIP (CLINITEK)  Result Value Ref Range   Color, UA yellow yellow   Clarity, UA clear clear   Glucose, UA negative negative mg/dL   Bilirubin, UA negative negative  Ketones, POC UA negative negative mg/dL   Spec Grav, UA 7.867 6.720 - 1.025   Blood, UA negative negative   pH, UA 7.0 5.0 - 8.0   POC PROTEIN,UA negative negative, trace   Urobilinogen, UA 0.2 0.2 or 1.0 E.U./dL   Nitrite, UA Negative Negative   Leukocytes, UA Negative Negative    Lab Results  Component Value Date   HGBA1C 5.9 (H) 09/18/2018   Reassurance urine looked great and no protein or glucose. She has no pain. I suspect some pelvic floor dysfunction and OAB. Discussed kegels. Consider no fluids 2 hours before bed to help with night  time urination. Keep pad in underwear. Continue to keep close follow up with any new symptoms. Continue to monitor blood glucose. This has been going on way to long to be amniotic fluid.

## 2019-09-23 DIAGNOSIS — Z3A27 27 weeks gestation of pregnancy: Secondary | ICD-10-CM | POA: Diagnosis not present

## 2019-09-23 DIAGNOSIS — O99810 Abnormal glucose complicating pregnancy: Secondary | ICD-10-CM | POA: Diagnosis not present

## 2019-09-23 DIAGNOSIS — R7303 Prediabetes: Secondary | ICD-10-CM | POA: Diagnosis not present

## 2019-10-02 DIAGNOSIS — Z3400 Encounter for supervision of normal first pregnancy, unspecified trimester: Secondary | ICD-10-CM | POA: Diagnosis not present

## 2019-10-02 DIAGNOSIS — O24819 Other pre-existing diabetes mellitus in pregnancy, unspecified trimester: Secondary | ICD-10-CM | POA: Diagnosis not present

## 2019-10-02 DIAGNOSIS — O24313 Unspecified pre-existing diabetes mellitus in pregnancy, third trimester: Secondary | ICD-10-CM | POA: Diagnosis not present

## 2019-10-08 DIAGNOSIS — O99810 Abnormal glucose complicating pregnancy: Secondary | ICD-10-CM | POA: Diagnosis not present

## 2019-10-08 DIAGNOSIS — R7303 Prediabetes: Secondary | ICD-10-CM | POA: Diagnosis not present

## 2019-10-08 DIAGNOSIS — Z3A29 29 weeks gestation of pregnancy: Secondary | ICD-10-CM | POA: Diagnosis not present

## 2019-10-10 ENCOUNTER — Ambulatory Visit (INDEPENDENT_AMBULATORY_CARE_PROVIDER_SITE_OTHER): Payer: 59 | Admitting: Physician Assistant

## 2019-10-10 DIAGNOSIS — Z20822 Contact with and (suspected) exposure to covid-19: Secondary | ICD-10-CM | POA: Diagnosis not present

## 2019-10-10 NOTE — Progress Notes (Signed)
Swabbed for covid

## 2019-10-13 LAB — NOVEL CORONAVIRUS, NAA: SARS-CoV-2, NAA: NOT DETECTED

## 2019-10-13 LAB — SARS-COV-2, NAA 2 DAY TAT

## 2019-10-13 NOTE — Progress Notes (Signed)
Negative for covid

## 2019-10-20 DIAGNOSIS — O368190 Decreased fetal movements, unspecified trimester, not applicable or unspecified: Secondary | ICD-10-CM | POA: Diagnosis not present

## 2019-10-20 DIAGNOSIS — O368130 Decreased fetal movements, third trimester, not applicable or unspecified: Secondary | ICD-10-CM | POA: Diagnosis not present

## 2019-10-28 DIAGNOSIS — Z3483 Encounter for supervision of other normal pregnancy, third trimester: Secondary | ICD-10-CM | POA: Diagnosis not present

## 2019-10-28 DIAGNOSIS — Z3482 Encounter for supervision of other normal pregnancy, second trimester: Secondary | ICD-10-CM | POA: Diagnosis not present

## 2019-10-30 DIAGNOSIS — O24313 Unspecified pre-existing diabetes mellitus in pregnancy, third trimester: Secondary | ICD-10-CM | POA: Diagnosis not present

## 2019-10-30 DIAGNOSIS — O2432 Unspecified pre-existing diabetes mellitus in childbirth: Secondary | ICD-10-CM | POA: Diagnosis not present

## 2019-10-31 ENCOUNTER — Ambulatory Visit (INDEPENDENT_AMBULATORY_CARE_PROVIDER_SITE_OTHER): Payer: 59 | Admitting: Physician Assistant

## 2019-10-31 VITALS — Temp 98.6°F

## 2019-10-31 DIAGNOSIS — Z23 Encounter for immunization: Secondary | ICD-10-CM | POA: Diagnosis not present

## 2019-10-31 NOTE — Progress Notes (Signed)
Established Patient Office Visit  Subjective:  Patient ID: Courtney Bailey, female    DOB: 11-Oct-1985  Age: 34 y.o. MRN: [id number]  CC: No chief complaint on file.   HPI Courtney Bailey presents for Tdap.   Past Medical History:  Diagnosis Date  . Disc degeneration, lumbar   . Sleep difficulties   . Weight gain     Past Surgical History:  Procedure Laterality Date  . COLONOSCOPY      Family History  Problem Relation Age of Onset  . Hypothyroidism Sister   . Breast cancer Neg Hx     Social History   Socioeconomic History  . Marital status: Single    Spouse name: Not on file  . Number of children: Not on file  . Years of education: Not on file  . Highest education level: Not on file  Occupational History  . Not on file  Tobacco Use  . Smoking status: Never Smoker  . Smokeless tobacco: Never Used  Substance and Sexual Activity  . Alcohol use: Yes    Comment: occ  . Drug use: Never  . Sexual activity: Yes    Birth control/protection: None  Other Topics Concern  . Not on file  Social History Narrative  . Not on file   Social Determinants of Health   Financial Resource Strain:   . Difficulty of Paying Living Expenses: Not on file  Food Insecurity:   . Worried About Programme researcher, broadcasting/film/video in the Last Year: Not on file  . Ran Out of Food in the Last Year: Not on file  Transportation Needs:   . Lack of Transportation (Medical): Not on file  . Lack of Transportation (Non-Medical): Not on file  Physical Activity:   . Days of Exercise per Week: Not on file  . Minutes of Exercise per Session: Not on file  Stress:   . Feeling of Stress : Not on file  Social Connections:   . Frequency of Communication with Friends and Family: Not on file  . Frequency of Social Gatherings with Friends and Family: Not on file  . Attends Religious Services: Not on file  . Active Member of Clubs or Organizations: Not on file  . Attends Banker Meetings: Not on file   . Marital Status: Not on file  Intimate Partner Violence:   . Fear of Current or Ex-Partner: Not on file  . Emotionally Abused: Not on file  . Physically Abused: Not on file  . Sexually Abused: Not on file    Outpatient Medications Prior to Visit  Medication Sig Dispense Refill  . albuterol (VENTOLIN HFA) 108 (90 Base) MCG/ACT inhaler Inhale 2 puffs into the lungs every 4 (four) hours as needed for wheezing or shortness of breath. 18 g 0  . aspirin 81 MG EC tablet Take by mouth.    . citalopram (CELEXA) 20 MG tablet Take 1 tablet (20 mg total) by mouth daily. 90 tablet 1  . Continuous Blood Gluc Receiver (FREESTYLE LIBRE 14 DAY READER) DEVI 1 applicator by Does not apply route every 14 (fourteen) days. 2 each 11  . Continuous Blood Gluc Sensor (FREESTYLE LIBRE 14 DAY SENSOR) MISC 1 Device by Does not apply route every 14 (fourteen) days. 2 each 11  . ketoconazole (NIZORAL) 2 % shampoo Apply 1 application topically 2 (two) times a week. 120 mL 1  . Prenatal Vit-DSS-Fe Cbn-FA (PRENATAL AD PO) Take by mouth.     No facility-administered medications prior  to visit.    Allergies  Allergen Reactions  . Penicillin G Other (See Comments)    Does not know reaction. Occurred in early childhood Does not know reaction. Occurred in early childhood   . Trazodone Other (See Comments)    Nightmares  . Escitalopram Oxalate Anxiety    Worsening anxiety symptoms.    ROS Review of Systems    Objective:    Physical Exam  Temp 98.6 F (37 C) (Oral)   LMP 03/14/2019 (Exact Date)  Wt Readings from Last 3 Encounters:  09/19/19 262 lb (118.8 kg)  07/22/19 252 lb (114.3 kg)  05/01/19 251 lb (113.9 kg)     Health Maintenance Due  Topic Date Due  . Hepatitis C Screening  Never done  . URINE MICROALBUMIN  Never done  . COVID-19 Vaccine (1) Never done  . HIV Screening  Never done  . PAP SMEAR-Modifier  03/20/2019  . INFLUENZA VACCINE  08/31/2019    There are no preventive care  reminders to display for this patient.  Lab Results  Component Value Date   TSH 1.99 05/01/2019   Lab Results  Component Value Date   WBC 6.5 05/01/2019   HGB 12.5 05/01/2019   HCT 38.4 05/01/2019   MCV 89.1 05/01/2019   PLT 351 05/01/2019   Lab Results  Component Value Date   NA 136 05/01/2019   K 4.1 05/01/2019   CO2 26 05/01/2019   GLUCOSE 94 05/01/2019   BUN 10 05/01/2019   CREATININE 0.63 05/01/2019   BILITOT 0.3 05/01/2019   AST 12 05/01/2019   ALT 13 05/01/2019   PROT 6.8 05/01/2019   CALCIUM 9.2 05/01/2019   Lab Results  Component Value Date   CHOL 219 (H) 09/18/2018   Lab Results  Component Value Date   HDL 43 (L) 09/18/2018   Lab Results  Component Value Date   LDLCALC 153 (H) 09/18/2018   Lab Results  Component Value Date   TRIG 116 09/18/2018   Lab Results  Component Value Date   CHOLHDL 5.1 (H) 09/18/2018   Lab Results  Component Value Date   HGBA1C 5.9 (H) 09/18/2018      Assessment & Plan:  Vaccination - Patient tolerated injection well without complications.  Problem List Items Addressed This Visit    None    Visit Diagnoses    Need for Tdap vaccination    -  Primary   Relevant Orders   Tdap vaccine greater than or equal to 7yo IM (Completed)      No orders of the defined types were placed in this encounter.   Follow-up: No follow-ups on file.    Esmond Harps, CMA

## 2019-11-03 DIAGNOSIS — Z3A33 33 weeks gestation of pregnancy: Secondary | ICD-10-CM | POA: Diagnosis not present

## 2019-11-03 DIAGNOSIS — R7303 Prediabetes: Secondary | ICD-10-CM | POA: Diagnosis not present

## 2019-11-03 DIAGNOSIS — O99810 Abnormal glucose complicating pregnancy: Secondary | ICD-10-CM | POA: Diagnosis not present

## 2019-11-06 DIAGNOSIS — O99213 Obesity complicating pregnancy, third trimester: Secondary | ICD-10-CM | POA: Diagnosis not present

## 2019-11-06 DIAGNOSIS — Z3403 Encounter for supervision of normal first pregnancy, third trimester: Secondary | ICD-10-CM | POA: Diagnosis not present

## 2019-11-06 DIAGNOSIS — O24819 Other pre-existing diabetes mellitus in pregnancy, unspecified trimester: Secondary | ICD-10-CM | POA: Diagnosis not present

## 2019-11-06 DIAGNOSIS — O24313 Unspecified pre-existing diabetes mellitus in pregnancy, third trimester: Secondary | ICD-10-CM | POA: Diagnosis not present

## 2019-11-10 ENCOUNTER — Encounter: Payer: Self-pay | Admitting: Family Medicine

## 2019-11-10 MED ORDER — INSULIN PEN NEEDLE 30G X 5 MM MISC: 1.0000 | Freq: Every day | 0 refills | Status: DC

## 2019-11-13 DIAGNOSIS — O24313 Unspecified pre-existing diabetes mellitus in pregnancy, third trimester: Secondary | ICD-10-CM | POA: Diagnosis not present

## 2019-11-19 DIAGNOSIS — O24313 Unspecified pre-existing diabetes mellitus in pregnancy, third trimester: Secondary | ICD-10-CM | POA: Diagnosis not present

## 2019-11-27 DIAGNOSIS — Z3400 Encounter for supervision of normal first pregnancy, unspecified trimester: Secondary | ICD-10-CM | POA: Diagnosis not present

## 2019-11-27 DIAGNOSIS — O24313 Unspecified pre-existing diabetes mellitus in pregnancy, third trimester: Secondary | ICD-10-CM | POA: Diagnosis not present

## 2019-11-28 ENCOUNTER — Ambulatory Visit: Payer: 59

## 2019-12-04 DIAGNOSIS — O24313 Unspecified pre-existing diabetes mellitus in pregnancy, third trimester: Secondary | ICD-10-CM | POA: Diagnosis not present

## 2019-12-12 DIAGNOSIS — Z23 Encounter for immunization: Secondary | ICD-10-CM | POA: Diagnosis not present

## 2019-12-12 DIAGNOSIS — F411 Generalized anxiety disorder: Secondary | ICD-10-CM | POA: Diagnosis not present

## 2019-12-12 DIAGNOSIS — Z3A39 39 weeks gestation of pregnancy: Secondary | ICD-10-CM | POA: Diagnosis not present

## 2019-12-12 DIAGNOSIS — O24424 Gestational diabetes mellitus in childbirth, insulin controlled: Secondary | ICD-10-CM | POA: Diagnosis not present

## 2019-12-12 DIAGNOSIS — O99344 Other mental disorders complicating childbirth: Secondary | ICD-10-CM | POA: Diagnosis not present

## 2019-12-12 DIAGNOSIS — O164 Unspecified maternal hypertension, complicating childbirth: Secondary | ICD-10-CM | POA: Diagnosis not present

## 2019-12-12 DIAGNOSIS — F419 Anxiety disorder, unspecified: Secondary | ICD-10-CM | POA: Diagnosis not present

## 2019-12-12 DIAGNOSIS — O24313 Unspecified pre-existing diabetes mellitus in pregnancy, third trimester: Secondary | ICD-10-CM | POA: Diagnosis not present

## 2019-12-12 DIAGNOSIS — O24414 Gestational diabetes mellitus in pregnancy, insulin controlled: Secondary | ICD-10-CM | POA: Diagnosis not present

## 2019-12-13 DIAGNOSIS — O99344 Other mental disorders complicating childbirth: Secondary | ICD-10-CM | POA: Diagnosis not present

## 2019-12-13 DIAGNOSIS — O164 Unspecified maternal hypertension, complicating childbirth: Secondary | ICD-10-CM | POA: Diagnosis not present

## 2019-12-13 DIAGNOSIS — O24414 Gestational diabetes mellitus in pregnancy, insulin controlled: Secondary | ICD-10-CM | POA: Diagnosis not present

## 2019-12-13 DIAGNOSIS — Z3A39 39 weeks gestation of pregnancy: Secondary | ICD-10-CM | POA: Diagnosis not present

## 2019-12-13 DIAGNOSIS — F419 Anxiety disorder, unspecified: Secondary | ICD-10-CM | POA: Diagnosis not present

## 2020-01-14 ENCOUNTER — Telehealth: Payer: Self-pay | Admitting: Physician Assistant

## 2020-01-14 MED ORDER — CITALOPRAM HYDROBROMIDE 40 MG PO TABS
40.0000 mg | ORAL_TABLET | Freq: Every day | ORAL | 0 refills | Status: DC
Start: 2020-01-14 — End: 2020-04-28

## 2020-01-14 NOTE — Telephone Encounter (Signed)
pts father is dying and see is having a really hard time. Increase to 40mg  of celexa.

## 2020-01-20 DIAGNOSIS — Z3482 Encounter for supervision of other normal pregnancy, second trimester: Secondary | ICD-10-CM | POA: Diagnosis not present

## 2020-01-20 DIAGNOSIS — Z3483 Encounter for supervision of other normal pregnancy, third trimester: Secondary | ICD-10-CM | POA: Diagnosis not present

## 2020-01-21 ENCOUNTER — Other Ambulatory Visit: Payer: Self-pay | Admitting: Physician Assistant

## 2020-01-21 DIAGNOSIS — N6320 Unspecified lump in the left breast, unspecified quadrant: Secondary | ICD-10-CM

## 2020-02-18 DIAGNOSIS — Z3483 Encounter for supervision of other normal pregnancy, third trimester: Secondary | ICD-10-CM | POA: Diagnosis not present

## 2020-02-18 DIAGNOSIS — Z3482 Encounter for supervision of other normal pregnancy, second trimester: Secondary | ICD-10-CM | POA: Diagnosis not present

## 2020-03-18 DIAGNOSIS — Z3483 Encounter for supervision of other normal pregnancy, third trimester: Secondary | ICD-10-CM | POA: Diagnosis not present

## 2020-03-18 DIAGNOSIS — Z3482 Encounter for supervision of other normal pregnancy, second trimester: Secondary | ICD-10-CM | POA: Diagnosis not present

## 2020-03-23 NOTE — Progress Notes (Signed)
Pt is breastfeeding and wanting a covid 19 deferment. Letter written.

## 2020-04-21 ENCOUNTER — Other Ambulatory Visit: Payer: Self-pay | Admitting: Physician Assistant

## 2020-04-21 DIAGNOSIS — R5383 Other fatigue: Secondary | ICD-10-CM

## 2020-04-28 ENCOUNTER — Other Ambulatory Visit: Payer: Self-pay | Admitting: Neurology

## 2020-04-28 MED ORDER — CITALOPRAM HYDROBROMIDE 40 MG PO TABS: 40.0000 mg | ORAL_TABLET | Freq: Every day | ORAL | 0 refills | Status: DC

## 2020-05-05 ENCOUNTER — Ambulatory Visit (INDEPENDENT_AMBULATORY_CARE_PROVIDER_SITE_OTHER): Payer: No Typology Code available for payment source | Admitting: Physician Assistant

## 2020-05-05 ENCOUNTER — Other Ambulatory Visit: Payer: Self-pay

## 2020-05-05 VITALS — BP 127/75 | HR 85 | Ht 67.0 in | Wt 270.0 lb

## 2020-05-05 DIAGNOSIS — F411 Generalized anxiety disorder: Secondary | ICD-10-CM

## 2020-05-05 DIAGNOSIS — O99345 Other mental disorders complicating the puerperium: Secondary | ICD-10-CM | POA: Diagnosis not present

## 2020-05-05 DIAGNOSIS — F53 Postpartum depression: Secondary | ICD-10-CM

## 2020-05-05 DIAGNOSIS — F4321 Adjustment disorder with depressed mood: Secondary | ICD-10-CM | POA: Diagnosis not present

## 2020-05-05 NOTE — Progress Notes (Signed)
   Subjective:    Patient ID: Courtney Bailey, female    DOB: 1985/11/29, 35 y.o.   MRN: [id number]  HPI  Pt is a 35 yo female with GAD who presents to the clinic to discuss mood.   Pt has a 18 month old, she works full time, and her father recently passed away. She is breast feeding as well. She just feels overwhelmed. She is tearful a lot and worried about the future. She does not feel supported at work. She wonders about her medication. No SI/HC.   Marland Kitchen. Active Ambulatory Problems    Diagnosis Date Noted  . Class 1 obesity due to excess calories without serious comorbidity with body mass index (BMI) of 33.0 to 33.9 in adult 12/17/2017  . Seborrheic dermatitis of scalp 12/17/2017  . Itchy scalp 12/17/2017  . Eyelid dermatitis, allergic/contact, right 12/17/2017  . Elevated fasting glucose 12/18/2017  . Dyslipidemia (high LDL; low HDL) 12/18/2017  . Pre-diabetes 12/21/2017  . Perceived hearing changes 01/04/2018  . ETD (Eustachian tube dysfunction), bilateral 01/04/2018  . Depression, recurrent (HCC) 02/19/2018  . Anxiety 02/19/2018  . Insomnia 02/19/2018  . Seasonal allergies 04/30/2018  . Seasonal allergic rhinitis due to pollen 04/30/2018  . Allergic conjunctivitis of both eyes 04/30/2018  . Spotting between menses 05/14/2018  . Breast mass, right 11/22/2018  . Frequent headaches 02/21/2019  . GAD (generalized anxiety disorder) 02/27/2019  . Inattention 03/05/2019  . Left lumbar radiculitis 04/01/2019  . Family history of blood clots 04/18/2019  . Pain of left calf 04/18/2019  . Abnormal glucose in pregnancy, antepartum 07/22/2019  . [redacted] weeks gestation of pregnancy 09/19/2019  . Urinary frequency 09/19/2019  . Iron deficiency anemia secondary to inadequate dietary iron intake 09/19/2019  . 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 Post partum depression 05/07/2020   Resolved Ambulatory Problems    Diagnosis Date Noted  . No Resolved Ambulatory Problems   Past Medical History:   Diagnosis Date  . Disc degeneration, lumbar   . Sleep difficulties   . Weight gain      Review of Systems  All other systems reviewed and are negative.      Objective:   Physical Exam Vitals reviewed.  Constitutional:      Appearance: Normal appearance.  Cardiovascular:     Rate and Rhythm: Normal rate and regular rhythm.     Pulses: Normal pulses.  Pulmonary:     Effort: Pulmonary effort is normal.  Neurological:     General: No focal deficit present.     Mental Status: She is alert and oriented to person, place, and time.  Psychiatric:     Comments: Tearful.            Assessment & Plan:  .Marland KitchenDaianna was seen today for follow-up.  Diagnoses and all orders for this visit:  GAD (generalized anxiety disorder) -     Ambulatory referral to Psychology  Post partum depression -     Ambulatory referral to Psychology  Complicated grief -     Ambulatory referral to Psychology   Pt certainly has a lot going on with a new baby, stress at work, death of her father. Needs counseling.  hesitant in changing medication right now because BF.  Discussed self care and communication with husband to help support her better.  Continue on celexa for now.  Follow up in 1 month.    Spent 30 minutes with patient discuss symptoms, plan, medication.

## 2020-05-05 NOTE — Patient Instructions (Signed)

## 2020-05-06 LAB — IRON,TIBC AND FERRITIN PANEL
%SAT: 23 % (calc) (ref 16–45)
Ferritin: 16 ng/mL (ref 16–154)
Iron: 90 ug/dL (ref 40–190)
TIBC: 398 mcg/dL (calc) (ref 250–450)

## 2020-05-06 LAB — TSH: TSH: 1.63 mIU/L

## 2020-05-06 LAB — CBC WITH DIFFERENTIAL/PLATELET
Absolute Monocytes: 372 cells/uL (ref 200–950)
Basophils Absolute: 41 cells/uL (ref 0–200)
Basophils Relative: 0.7 %
Eosinophils Absolute: 307 cells/uL (ref 15–500)
Eosinophils Relative: 5.2 %
HCT: 40.4 % (ref 35.0–45.0)
Hemoglobin: 12.9 g/dL (ref 11.7–15.5)
Lymphs Abs: 1735 cells/uL (ref 850–3900)
MCH: 26.8 pg — ABNORMAL LOW (ref 27.0–33.0)
MCHC: 31.9 g/dL — ABNORMAL LOW (ref 32.0–36.0)
MCV: 84 fL (ref 80.0–100.0)
MPV: 9.6 fL (ref 7.5–12.5)
Monocytes Relative: 6.3 %
Neutro Abs: 3446 cells/uL (ref 1500–7800)
Neutrophils Relative %: 58.4 %
Platelets: 357 10*3/uL (ref 140–400)
RBC: 4.81 10*6/uL (ref 3.80–5.10)
RDW: 15.6 % — ABNORMAL HIGH (ref 11.0–15.0)
Total Lymphocyte: 29.4 %
WBC: 5.9 10*3/uL (ref 3.8–10.8)

## 2020-05-06 LAB — COMPLETE METABOLIC PANEL WITH GFR
AG Ratio: 1.3 (calc) (ref 1.0–2.5)
ALT: 38 U/L — ABNORMAL HIGH (ref 6–29)
AST: 29 U/L (ref 10–30)
Albumin: 4.2 g/dL (ref 3.6–5.1)
Alkaline phosphatase (APISO): 96 U/L (ref 31–125)
BUN: 15 mg/dL (ref 7–25)
CO2: 29 mmol/L (ref 20–32)
Calcium: 9.7 mg/dL (ref 8.6–10.2)
Chloride: 104 mmol/L (ref 98–110)
Creat: 0.7 mg/dL (ref 0.50–1.10)
GFR, Est African American: 133 mL/min/{1.73_m2} (ref >=60)
GFR, Est Non African American: 115 mL/min/{1.73_m2} (ref >=60)
Globulin: 3.2 g/dL (calc) (ref 1.9–3.7)
Glucose, Bld: 128 mg/dL — ABNORMAL HIGH (ref 65–99)
Potassium: 4.4 mmol/L (ref 3.5–5.3)
Sodium: 139 mmol/L (ref 135–146)
Total Bilirubin: 0.4 mg/dL (ref 0.2–1.2)
Total Protein: 7.4 g/dL (ref 6.1–8.1)

## 2020-05-06 LAB — VITAMIN D 25 HYDROXY (VIT D DEFICIENCY, FRACTURES): Vit D, 25-Hydroxy: 30 ng/mL (ref 30–100)

## 2020-05-06 LAB — B12 AND FOLATE PANEL
Folate: >24 ng/mL
Vitamin B-12: 755 pg/mL (ref 200–1100)

## 2020-05-06 NOTE — Progress Notes (Signed)
Diana,   Your thyroid is great.  B12 is great.  Vitamin D is low normal. Continue at least D3 1000 units daily.  One liver enzyme up some. We will keep an eye on that and recheck in next 4 weeks.  Continues to have low iron stores. Continue to make sure taking iron supplement.

## 2020-05-07 ENCOUNTER — Encounter: Payer: Self-pay | Admitting: Physician Assistant

## 2020-05-07 DIAGNOSIS — F4381 Prolonged grief disorder: Secondary | ICD-10-CM | POA: Insufficient documentation

## 2020-05-07 DIAGNOSIS — F4321 Adjustment disorder with depressed mood: Secondary | ICD-10-CM | POA: Insufficient documentation

## 2020-05-07 DIAGNOSIS — F53 Postpartum depression: Secondary | ICD-10-CM | POA: Insufficient documentation

## 2020-05-07 NOTE — Progress Notes (Signed)
Increase to 60mg.

## 2020-05-26 ENCOUNTER — Other Ambulatory Visit: Payer: Self-pay | Admitting: Physician Assistant

## 2020-05-26 MED ORDER — TRIAMCINOLONE ACETONIDE 0.1 % EX CREA: 1.0000 "application " | TOPICAL_CREAM | Freq: Two times a day (BID) | CUTANEOUS | 0 refills | Status: DC

## 2020-06-02 ENCOUNTER — Other Ambulatory Visit: Payer: Self-pay | Admitting: Neurology

## 2020-06-02 MED ORDER — KETOCONAZOLE 2 % EX SHAM: 1.0000 "application " | MEDICATED_SHAMPOO | CUTANEOUS | 1 refills | Status: DC

## 2020-06-15 ENCOUNTER — Encounter: Payer: Self-pay | Admitting: Physician Assistant

## 2020-06-15 ENCOUNTER — Telehealth (INDEPENDENT_AMBULATORY_CARE_PROVIDER_SITE_OTHER): Payer: No Typology Code available for payment source | Admitting: Physician Assistant

## 2020-06-15 VITALS — Temp 97.9°F | Ht 67.0 in | Wt 270.0 lb

## 2020-06-15 DIAGNOSIS — J069 Acute upper respiratory infection, unspecified: Secondary | ICD-10-CM | POA: Diagnosis not present

## 2020-06-15 DIAGNOSIS — J040 Acute laryngitis: Secondary | ICD-10-CM | POA: Diagnosis not present

## 2020-06-15 MED ORDER — FLUTICASONE PROPIONATE 50 MCG/ACT NA SUSP: 2.0000 | Freq: Every day | NASAL | 1 refills | Status: DC

## 2020-06-15 NOTE — Progress Notes (Signed)
..Virtual Visit via Video Note  I connected with Courtney Bailey on 06/15/20 at  2:40 PM EDT by a video enabled telemedicine application and verified that I am speaking with the correct person using two identifiers.  Location: Patient: home Provider: clinic  .Marland KitchenParticipating in visit:  Patient: Courtney Bailey Provider: Tandy Gaw PA-C   I discussed the limitations of evaluation and management by telemedicine and the availability of in person appointments. The patient expressed understanding and agreed to proceed.  History of Present Illness: Patient is a 35 year old female who calls into the clinic with losing her voice, cough, sinus congestion, ear pressure, sore throat, headache for the last 36 hours.  Her 103-month-old daughter is also sick with similar symptoms.  She denies any fever, chills.  She does have some body aches.  She denies any significant shortness of breath.  She has not tried anything to make better.  She has not had the COVID-vaccine due to being pregnant and breast-feeding.  .. Active Ambulatory Problems    Diagnosis Date Noted  . Class 1 obesity due to excess calories without serious comorbidity with body mass index (BMI) of 33.0 to 33.9 in adult 12/17/2017  . Seborrheic dermatitis of scalp 12/17/2017  . Itchy scalp 12/17/2017  . Eyelid dermatitis, allergic/contact, right 12/17/2017  . Elevated fasting glucose 12/18/2017  . Dyslipidemia (high LDL; low HDL) 12/18/2017  . Pre-diabetes 12/21/2017  . Perceived hearing changes 01/04/2018  . ETD (Eustachian tube dysfunction), bilateral 01/04/2018  . Depression, recurrent (HCC) 02/19/2018  . Anxiety 02/19/2018  . Insomnia 02/19/2018  . Seasonal allergies 04/30/2018  . Seasonal allergic rhinitis due to pollen 04/30/2018  . Allergic conjunctivitis of both eyes 04/30/2018  . Spotting between menses 05/14/2018  . Breast mass, right 11/22/2018  . Frequent headaches 02/21/2019  . GAD (generalized anxiety disorder) 02/27/2019   . Inattention 03/05/2019  . Left lumbar radiculitis 04/01/2019  . Family history of blood clots 04/18/2019  . Pain of left calf 04/18/2019  . Abnormal glucose in pregnancy, antepartum 07/22/2019  . [redacted] weeks gestation of pregnancy 09/19/2019  . Urinary frequency 09/19/2019  . Iron deficiency anemia secondary to inadequate dietary iron intake 09/19/2019  . 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 Post partum depression 05/07/2020   Resolved Ambulatory Problems    Diagnosis Date Noted  . No Resolved Ambulatory Problems   Past Medical History:  Diagnosis Date  . Disc degeneration, lumbar   . Sleep difficulties   . Weight gain        Observations/Objective: No acute distress. Voice cracking No labored breathing Congested sounding  .Marland Kitchen Today's Vitals   06/15/20 1314  Weight: 270 lb (122.5 kg)  Height: 5\' 7"  (1.702 m)   Body mass index is 42.29 kg/m.    Assessment and Plan: Marland KitchenDiagnoses and all orders for this visit:  Laryngitis  Viral upper respiratory tract infection -     fluticasone (FLONASE) 50 MCG/ACT nasal spray; Place 2 sprays into both nostrils daily.   Unclear etiology of symptoms.  Likely viral upper respiratory infection from her infant daughter with similar symptoms.  Due to being pregnant and breast-feeding patient has not had the COVID-vaccine.  She was sent to be swabbed for COVID. Discussed symptomatic care with Tylenol Cold sinus severe and Flonase.  Consider cough drops and honey tea for throat and voice.  Encouraged to use voice rest.  Rest and hydrate.  Written out of work until her COVID test is back.     Follow Up Instructions:  I discussed the assessment and treatment plan with the patient. The patient was provided an opportunity to ask questions and all were answered. The patient agreed with the plan and demonstrated an understanding of the instructions.   The patient was advised to call back or seek an in-person evaluation if the symptoms  worsen or if the condition fails to improve as anticipated.    Tandy Gaw, PA-C

## 2020-06-15 NOTE — Patient Instructions (Addendum)
Ibuprofen/tylenol cold sinus severe Laryngitis Laryngitis is irritation and swelli ng (inflammation) of your vocal cords. This condition causes symptoms such as:  A change in your voice. It may sound low and hoarse.  Loss of voice.  Coughing.  Sore throat.  Dry throat.  Stuffy nose. Depending on the cause, this condition may go away after a short time or may last for more than 3 weeks. Treatment often involves resting your voice and using medicines to soothe your throat. Follow these instructions at home: Medicines  Take over-the-counter and prescription medicines only as told by your doctor.  If you were prescribed an antibiotic medicine, take it as told by your doctor. Do not stop taking it even if you start to feel better. General instructions  Talk as little as possible. Also avoid whispering.  Write instead of talking. Do this until your voice is back to normal.  Drink enough fluid to keep your pee (urine) pale yellow.  Breathe in moist air. Use a humidifier if you live in a dry climate.  Do not use any products that have nicotine or tobacco in them, such as cigarettes and e-cigarettes. If you need help quitting, ask your doctor. Contact a doctor if:  You have a fever.  Your pain is worse.  Your symptoms do not get better in 2 weeks. Get help right away if:  You cough up blood.  You have trouble swallowing.  You have trouble breathing. Summary  Laryngitis is inflammation of your vocal cords.  This condition causes your voice to sound low and hoarse.  Rest your voice by talking as little as possible. Also avoid whispering. This information is not intended to replace advice given to you by your health care provider. Make sure you discuss any questions you have with your health care provider. Document Revised: 01/03/2017 Document Reviewed: 01/03/2017 Elsevier Patient Education  2021 [employer].

## 2020-06-24 ENCOUNTER — Other Ambulatory Visit: Payer: Self-pay

## 2020-06-24 ENCOUNTER — Encounter: Payer: Self-pay | Admitting: Osteopathic Medicine

## 2020-06-24 ENCOUNTER — Ambulatory Visit (INDEPENDENT_AMBULATORY_CARE_PROVIDER_SITE_OTHER): Payer: No Typology Code available for payment source | Admitting: Osteopathic Medicine

## 2020-06-24 VITALS — BP 120/86 | HR 84 | Temp 98.0°F

## 2020-06-24 DIAGNOSIS — Z20828 Contact with and (suspected) exposure to other viral communicable diseases: Secondary | ICD-10-CM

## 2020-06-24 DIAGNOSIS — R112 Nausea with vomiting, unspecified: Secondary | ICD-10-CM | POA: Diagnosis not present

## 2020-06-24 DIAGNOSIS — J101 Influenza due to other identified influenza virus with other respiratory manifestations: Secondary | ICD-10-CM

## 2020-06-24 DIAGNOSIS — R55 Syncope and collapse: Secondary | ICD-10-CM | POA: Diagnosis not present

## 2020-06-24 LAB — POCT INFLUENZA A/B
Influenza A, POC: NEGATIVE
Influenza B, POC: POSITIVE — AB

## 2020-06-24 LAB — POCT CBG (FASTING - GLUCOSE)-MANUAL ENTRY: Glucose Fasting, POC: 81 mg/dL (ref 70–99)

## 2020-06-24 MED ORDER — PROMETHAZINE HCL 25 MG/ML IJ SOLN
25.0000 mg | Freq: Once | INTRAMUSCULAR | Status: AC
  Administered 2020-06-24: 25 mg via INTRAMUSCULAR

## 2020-06-24 MED ORDER — ONDANSETRON 8 MG PO TBDP: 8.0000 mg | ORAL_TABLET | Freq: Three times a day (TID) | ORAL | 3 refills | Status: DC | PRN

## 2020-06-24 MED ORDER — OSELTAMIVIR PHOSPHATE 75 MG PO CAPS: 75.0000 mg | ORAL_CAPSULE | Freq: Two times a day (BID) | ORAL | 0 refills | Status: DC

## 2020-06-24 NOTE — Patient Instructions (Signed)

## 2020-06-24 NOTE — Progress Notes (Signed)
Courtney Bailey is a 35 y.o. female who presents to  [facility] at [facility]  today, 06/24/20, seeking care for the following:  . Near syncope, nausea: was sitting in her car eating lunch, sudden onset nausea and not feeling right, just feeling weak like she couldn't walk. Called a coworker who brought a wheelchair out to the parking lot and brought her inside where I began examination. Pt was alert and talkative, no obvious neuro deficits, see VS and exam below. She reported previous episodes of dizziness and seeing stars, but this time no stars. Onset of symptoms at rest, was NOT triggered by changing position. Head feels heavy not no headache or vision change. Feels weak on left arm but moving it okay. (+)small amount of vomiting in exam room, non-bloody non-bilious. She was able to get up from wheelchair to transfer to exam table for EKG. She is breastfeeding, denies possibility of pregnancy. She denies hx seizure, VTE, cardiovascular illness. Her partner has been sick recently, his mother had influenza, her daughter was also sick w/ viral illness. Only meds currently taking are PVN and Celexa.      ASSESSMENT & PLAN with other pertinent findings:  The primary encounter diagnosis was Influenza B. Diagnoses of Near syncope, Exposure to the flu, and Nausea and vomiting, intractability of vomiting not specified, unspecified vomiting type were also pertinent to this visit.    (+)Flu B Attempted IV placement but unable to locate a good vein Phenergan helped nausea, pt is tolerating po  Mom came to pick her up  Tamiflu ppx sent for patient's daughter     Results for orders placed or performed in visit on 06/24/20 (from the past 24 hour(s))  POCT Influenza A/B     Status: Abnormal   Collection Time: 06/24/20  1:54 PM  Result Value Ref Range   Influenza A, POC Negative Negative   Influenza B, POC Positive (A) Negative  POCT CBG (Fasting -  Glucose)     Status: None   Collection Time: 06/24/20  1:54 PM  Result Value Ref Range   Glucose Fasting, POC 81 70 - 99 mg/dL   Meds ordered this encounter  Medications  . promethazine (PHENERGAN) injection 25 mg  . oseltamivir (TAMIFLU) 75 MG capsule    Sig: Take 1 capsule (75 mg total) by mouth 2 (two) times daily.    Dispense:  10 capsule    Refill:  0  . ondansetron (ZOFRAN-ODT) 8 MG disintegrating tablet    Sig: Take 1 tablet (8 mg total) by mouth every 8 (eight) hours as needed for nausea.    Dispense:  20 tablet    Refill:  3     Patient Instructions   Influenza, Adult Influenza, also called "the flu," is a viral infection that mainly affects the respiratory tract. This includes the lungs, nose, and throat. The flu spreads easily from person to person (is contagious). It causes common cold symptoms, along with high fever and body aches. What are the causes? This condition is caused by the influenza virus. You can get the virus by:  Breathing in droplets that are in the air from an infected person's cough or sneeze.  Touching something that has the virus on it (has been contaminated) and then touching your mouth, nose, or eyes. What increases the risk? The following factors may make you more likely to get the flu:  Not washing or sanitizing your hands often.  Having close contact  with many people during cold and flu season.  Touching your mouth, eyes, or nose without first washing or sanitizing your hands.  Not getting an annual flu shot. You may have a higher risk for the flu, including serious problems, such as a lung infection (pneumonia), if you:  Are older than 65.  Are pregnant.  Have a weakened disease-fighting system (immune system). This includes people who have HIV or AIDS, are on chemotherapy, or are taking medicines that reduce (suppress) the immune system.  Have a long-term (chronic) illness, such as heart disease, kidney disease, diabetes, or lung  disease.  Have a liver disorder.  Are severely overweight (morbidly obese).  Have anemia.  Have asthma. What are the signs or symptoms? Symptoms of this condition usually begin suddenly and last 4-14 days. These may include:  Fever and chills.  Headaches, body aches, or muscle aches.  Sore throat.  Cough.  Runny or stuffy (congested) nose.  Chest discomfort.  Poor appetite.  Weakness or fatigue.  Dizziness.  Nausea or vomiting. How is this diagnosed? This condition may be diagnosed based on:  Your symptoms and medical history.  A physical exam.  Swabbing your nose or throat and testing the fluid for the influenza virus. How is this treated? If the flu is diagnosed early, you can be treated with antiviral medicine that is given by mouth (orally) or through an IV. This can help reduce how severe the illness is and how long it lasts. Taking care of yourself at home can help relieve symptoms. Your health care provider may recommend:  Taking over-the-counter medicines.  Drinking plenty of fluids. In many cases, the flu goes away on its own. If you have severe symptoms or complications, you may be treated in a hospital. Follow these instructions at home: Activity  Rest as needed and get plenty of sleep.  Stay home from work or school as told by your health care provider. Unless you are visiting your health care provider, avoid leaving home until your fever has been gone for 24 hours without taking medicine. Eating and drinking  Take an oral rehydration solution (ORS). This is a drink that is sold at pharmacies and retail stores.  Drink enough fluid to keep your urine pale yellow.  Drink clear fluids in small amounts as you are able. Clear fluids include water, ice chips, fruit juice mixed with water, and low-calorie sports drinks.  Eat bland, easy-to-digest foods in small amounts as you are able. These foods include bananas, applesauce, rice, lean meats, toast,  and crackers.  Avoid drinking fluids that contain a lot of sugar or caffeine, such as energy drinks, regular sports drinks, and soda.  Avoid alcohol.  Avoid spicy or fatty foods. General instructions  Take over-the-counter and prescription medicines only as told by your health care provider.  Use a cool mist humidifier to add humidity to the air in your home. This can make it easier to breathe. ? When using a cool mist humidifier, clean it daily. Empty the water and replace it with clean water.  Cover your mouth and nose when you cough or sneeze.  Wash your hands with soap and water often and for at least 20 seconds, especially after you cough or sneeze. If soap and water are not available, use alcohol-based hand sanitizer.  Keep all follow-up visits. This is important.      How is this prevented?  Get an annual flu shot. This is usually available in late summer, fall, or winter.  Ask your health care provider when you should get your flu shot.  Avoid contact with people who are sick during cold and flu season. This is generally fall and winter.   Contact a health care provider if:  You develop new symptoms.  You have: ? Chest pain. ? Diarrhea. ? A fever.  Your cough gets worse.  You produce more mucus.  You feel nauseous or you vomit. Get help right away if you:  Develop shortness of breath or have difficulty breathing.  Have skin or nails that turn a bluish color.  Have severe pain or stiffness in your neck.  Develop a sudden headache or sudden pain in your face or ear.  Cannot eat or drink without vomiting. These symptoms may represent a serious problem that is an emergency. Do not wait to see if the symptoms will go away. Get medical help right away. Call your local emergency services (911 in the [place] Do not drive yourself to the hospital. Summary  Influenza, also called "the flu," is a viral infection that primarily affects your respiratory  tract.  Symptoms of the flu usually begin suddenly and last 4-14 days.  Getting an annual flu shot is the best way to prevent getting the flu.  Stay home from work or school as told by your health care provider. Unless you are visiting your health care provider, avoid leaving home until your fever has been gone for 24 hours without taking medicine.  Keep all follow-up visits. This is important. This information is not intended to replace advice given to you by your health care provider. Make sure you discuss any questions you have with your health care provider. Document Revised: 09/05/2019 Document Reviewed: 09/05/2019 Elsevier Patient Education  2021 [employer]vier Inc.    Orders Placed This Encounter  Procedures  . POCT Influenza A/B  . POCT CBG (Fasting - Glucose)     See below for relevant physical exam findings  See below for recent lab and imaging results reviewed  Medications, allergies, PMH, PSH, SocH, FamH reviewed below    Follow-up instructions: Return if symptoms worsen or fail to improve.                                        Exam:  BP 120/86 (BP Location: Right Arm, Patient Position: Sitting, Cuff Size: Large)   Pulse 84   Temp 98 F (36.7 C) (Oral)   Constitutional: VS see above. General Appearance: alert, well-developed, well-nourished, mild distress pt reports d/t anxiety  Neck: No masses, trachea midline.   Respiratory: Increased respiratory effort but breath sounds normal. no wheeze, no rhonchi, no rales  Cardiovascular: S1/S2 normal, no murmur, no rub/gallop auscultated. RRR.   Musculoskeletal: Gait not examined. Symmetric and independent movement of all extremities, can flex hip and extend knee against resistance, can abduct arms against resistance, grip strength equal bilaterally.   Abdominal: non-tender, non-distended,  Neurological: Normal coordination. No tremor. Normal strength see above. EOMI, PERRL.    Skin: warm, intact. Sweating but no pallor or significant diaphoresis.   Psychiatric: Normal judgment/insight. Anxious mood and affect. Oriented x3.    EKG interpretation: Rate: 65 Rhythm: sinus No ST/T changes concerning for acute ischemia/infarct  Previous EKG no significant change from previous tracing 05/01/19  Current Meds  Medication Sig  . albuterol (VENTOLIN HFA) 108 (90 Base) MCG/ACT inhaler Inhale 2 puffs into the lungs every 4 (  four) hours as needed for wheezing or shortness of breath.  . citalopram (CELEXA) 40 MG tablet Take 1 tablet (40 mg total) by mouth daily.  . fluticasone (FLONASE) 50 MCG/ACT nasal spray Place 2 sprays into both nostrils daily.  Marland Kitchen ketoconazole (NIZORAL) 2 % shampoo Apply 1 application topically 2 (two) times a week.  . ondansetron (ZOFRAN-ODT) 8 MG disintegrating tablet Take 1 tablet (8 mg total) by mouth every 8 (eight) hours as needed for nausea.  Marland Kitchen oseltamivir (TAMIFLU) 75 MG capsule Take 1 capsule (75 mg total) by mouth 2 (two) times daily.  . Prenatal Vit-DSS-Fe Cbn-FA (PRENATAL AD PO) Take by mouth.  . triamcinolone cream (KENALOG) 0.1 % Apply 1 application topically 2 (two) times daily.    Allergies  Allergen Reactions  . Penicillin G Other (See Comments)    Does not know reaction. Occurred in early childhood    . Trazodone Other (See Comments)    Nightmares  . Escitalopram Oxalate Anxiety    Worsening anxiety symptoms.    Patient Active Problem List   Diagnosis Date Noted  . 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 Post partum depression 05/07/2020  . Urinary frequency 09/19/2019  . Iron deficiency anemia secondary to inadequate dietary iron intake 09/19/2019  . Abnormal glucose in pregnancy, antepartum 07/22/2019  . Family history of blood clots 04/18/2019  . Pain of left calf 04/18/2019  . Left lumbar radiculitis 04/01/2019  . Inattention 03/05/2019  . GAD (generalized anxiety disorder) 02/27/2019  . Frequent headaches 02/21/2019   . Breast mass, right 11/22/2018  . Spotting between menses 05/14/2018  . Seasonal allergies 04/30/2018  . Seasonal allergic rhinitis due to pollen 04/30/2018  . Allergic conjunctivitis of both eyes 04/30/2018  . Depression, recurrent (HCC) 02/19/2018  . Anxiety 02/19/2018  . Insomnia 02/19/2018  . Perceived hearing changes 01/04/2018  . ETD (Eustachian tube dysfunction), bilateral 01/04/2018  . Pre-diabetes 12/21/2017  . Elevated fasting glucose 12/18/2017  . Dyslipidemia (high LDL; low HDL) 12/18/2017  . Class 1 obesity due to excess calories without serious comorbidity with body mass index (BMI) of 33.0 to 33.9 in adult 12/17/2017  . Seborrheic dermatitis of scalp 12/17/2017  . Itchy scalp 12/17/2017  . Eyelid dermatitis, allergic/contact, right 12/17/2017    Family History  Problem Relation Age of Onset  . Hypothyroidism Sister   . Breast cancer Neg Hx     Social History   Tobacco Use  Smoking Status Never Smoker  Smokeless Tobacco Never Used    Past Surgical History:  Procedure Laterality Date  . COLONOSCOPY      Immunization History  Administered Date(s) Administered  . Influenza,inj,quad, With Preservative 12/14/2019  . Influenza-Unspecified 11/10/2015, 11/09/2016, 11/22/2018  . Tdap 10/31/2019    Recent Results (from the past 2160 hour(s))  TSH     Status: None   Collection Time: 05/05/20 12:00 AM  Result Value Ref Range   TSH 1.63 mIU/L    Comment:           Reference Range .           > or = 20 Years  0.40-4.50 .                Pregnancy Ranges           First trimester    0.26-2.66           Second trimester   0.55-2.73           Third trimester    0.43-2.91  CBC with Differential/Platelet     Status: Abnormal   Collection Time: 05/05/20 12:00 AM  Result Value Ref Range   WBC 5.9 3.8 - 10.8 Thousand/uL   RBC 4.81 3.80 - 5.10 Million/uL   Hemoglobin 12.9 11.7 - 15.5 g/dL   HCT 57.840.4 46.935.0 - 62.945.0 %   MCV 84.0 80.0 - 100.0 fL   MCH 26.8 (L)  27.0 - 33.0 pg   MCHC 31.9 (L) 32.0 - 36.0 g/dL   RDW 52.815.6 (H) 41.311.0 - 24.415.0 %   Platelets 357 140 - 400 Thousand/uL   MPV 9.6 7.5 - 12.5 fL   Neutro Abs 3,446 1,500 - 7,800 cells/uL   Lymphs Abs 1,735 850 - 3,900 cells/uL   Absolute Monocytes 372 200 - 950 cells/uL   Eosinophils Absolute 307 15 - 500 cells/uL   Basophils Absolute 41 0 - 200 cells/uL   Neutrophils Relative % 58.4 %   Total Lymphocyte 29.4 %   Monocytes Relative 6.3 %   Eosinophils Relative 5.2 %   Basophils Relative 0.7 %  Fe+TIBC+Fer     Status: None   Collection Time: 05/05/20 12:00 AM  Result Value Ref Range   Iron 90 40 - 190 mcg/dL   TIBC 010398 272250 - 536450 mcg/dL (calc)   %SAT 23 16 - 45 % (calc)   Ferritin 16 16 - 154 ng/mL  COMPLETE METABOLIC PANEL WITH GFR     Status: Abnormal   Collection Time: 05/05/20 12:00 AM  Result Value Ref Range   Glucose, Bld 128 (H) 65 - 99 mg/dL    Comment: .            Fasting reference interval . For someone without known diabetes, a glucose value >125 mg/dL indicates that they may have diabetes and this should be confirmed with a follow-up test. .    BUN 15 7 - 25 mg/dL   Creat 6.440.70 0.340.50 - 7.421.10 mg/dL   GFR, Est Non African American 115 > OR = 60 mL/min/1.6873m2   GFR, Est African American 133 > OR = 60 mL/min/1.5173m2   BUN/Creatinine Ratio NOT APPLICABLE 6 - 22 (calc)   Sodium 139 135 - 146 mmol/L   Potassium 4.4 3.5 - 5.3 mmol/L   Chloride 104 98 - 110 mmol/L   CO2 29 20 - 32 mmol/L   Calcium 9.7 8.6 - 10.2 mg/dL   Total Protein 7.4 6.1 - 8.1 g/dL   Albumin 4.2 3.6 - 5.1 g/dL   Globulin 3.2 1.9 - 3.7 g/dL (calc)   AG Ratio 1.3 1.0 - 2.5 (calc)   Total Bilirubin 0.4 0.2 - 1.2 mg/dL   Alkaline phosphatase (APISO) 96 31 - 125 U/L   AST 29 10 - 30 U/L   ALT 38 (H) 6 - 29 U/L  VITAMIN D 25 Hydroxy (Vit-D Deficiency, Fractures)     Status: None   Collection Time: 05/05/20 12:00 AM  Result Value Ref Range   Vit D, 25-Hydroxy 30 30 - 100 ng/mL    Comment: Vitamin D  Status         25-OH Vitamin D: . Deficiency:                    <20 ng/mL Insufficiency:             20 - 29 ng/mL Optimal:                 > or = 30 ng/mL . For 25-OH Vitamin D  testing on patients on  D2-supplementation and patients for whom quantitation  of D2 and D3 fractions is required, the [facility](TM) 25-OH VIT D, (D2,D3), LC/MS/MS is recommended: order  code 16109 (patients >58yrs). See Note 1 . Note 1 . For additional information, please refer to  http://education.[facility].com/faq/FAQ199  (This link is being provided for informational/ educational purposes only.)   B12 and Folate Panel     Status: None   Collection Time: 05/05/20 12:00 AM  Result Value Ref Range   Vitamin B-12 755 200 - 1,100 pg/mL   Folate >24.0 ng/mL    Comment:                            Reference Range                            Low:           <3.4                            Borderline:    3.4-5.4                            Normal:        >5.4 .   POCT Influenza A/B     Status: Abnormal   Collection Time: 06/24/20  1:54 PM  Result Value Ref Range   Influenza A, POC Negative Negative   Influenza B, POC Positive (A) Negative  POCT CBG (Fasting - Glucose)     Status: None   Collection Time: 06/24/20  1:54 PM  Result Value Ref Range   Glucose Fasting, POC 81 70 - 99 mg/dL    No results found.     All questions at time of visit were answered - patient instructed to contact office with any additional concerns or updates. ER/RTC precautions were reviewed with the patient as applicable.   Please note: manual typing as well as voice recognition software may have been used to produce this document - typos may escape review. Please contact Dr. Lyn Hollingshead for any needed clarifications.

## 2020-08-03 ENCOUNTER — Ambulatory Visit (INDEPENDENT_AMBULATORY_CARE_PROVIDER_SITE_OTHER): Payer: No Typology Code available for payment source | Admitting: Physician Assistant

## 2020-08-03 ENCOUNTER — Encounter: Payer: Self-pay | Admitting: Physician Assistant

## 2020-08-03 ENCOUNTER — Other Ambulatory Visit: Payer: Self-pay

## 2020-08-03 VITALS — BP 124/66 | HR 82 | Temp 97.6°F | Resp 18 | Wt 281.0 lb

## 2020-08-03 DIAGNOSIS — J029 Acute pharyngitis, unspecified: Secondary | ICD-10-CM | POA: Diagnosis not present

## 2020-08-03 LAB — POCT RAPID STREP A (OFFICE): Rapid Strep A Screen: NEGATIVE

## 2020-08-03 NOTE — Progress Notes (Signed)
Subjective:     Patient ID: Courtney Bailey, female   DOB: January 15, 1986, 35 y.o.   MRN: [id number]  HPI Pt is a 35 yo female who presents to the clinic to discuss sore throat. It started a few days ago. Her 48 month old daughter is also sick with similar symptoms. 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 She denies any cough, congestion, headaches, sinus pressure, SOB, sinus pressure of ear pain. She can eat and drink  fine.   .. Active Ambulatory Problems    Diagnosis Date Noted   Class 1 obesity due to excess calories without serious comorbidity with body mass index (BMI) of 33.0 to 33.9 in adult 12/17/2017   Seborrheic dermatitis of scalp 12/17/2017   Itchy scalp 12/17/2017   Eyelid dermatitis, allergic/contact, right 12/17/2017   Elevated fasting glucose 12/18/2017   Dyslipidemia (high LDL; low HDL) 12/18/2017   Pre-diabetes 12/21/2017   Perceived hearing changes 01/04/2018   ETD (Eustachian tube dysfunction), bilateral 01/04/2018   Depression, recurrent (HCC) 02/19/2018   Anxiety 02/19/2018   Insomnia 02/19/2018   Seasonal allergies 04/30/2018   Seasonal allergic rhinitis due to pollen 04/30/2018   Allergic conjunctivitis of both eyes 04/30/2018   Spotting between menses 05/14/2018   Breast mass, right 11/22/2018   Frequent headaches 02/21/2019   GAD (generalized anxiety disorder) 02/27/2019   Inattention 03/05/2019   Left lumbar radiculitis 04/01/2019   Family history of blood clots 04/18/2019   Pain of left calf 04/18/2019   Abnormal glucose in pregnancy, antepartum 07/22/2019   Urinary frequency 09/19/2019   Iron deficiency anemia secondary to inadequate dietary iron intake 09/19/2019   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     Objective:   Physical Exam Vitals reviewed.  Constitutional:      Appearance: She is well-developed.  HENT:     Head: Normocephalic.     Right Ear: Tympanic membrane normal.     Left Ear: Tympanic membrane normal.     Nose: No congestion.     Mouth/Throat:     Mouth: Mucous membranes are moist.     Pharynx: Uvula midline. Posterior oropharyngeal erythema present. No pharyngeal swelling or oropharyngeal exudate.  Eyes:     Conjunctiva/sclera: Conjunctivae normal.  Neck:     Thyroid: No thyromegaly.  Cardiovascular:     Rate and Rhythm: Normal rate.  Pulmonary:     Effort: Pulmonary effort is normal.     Breath sounds: Normal breath sounds.  Musculoskeletal:     Cervical back: Normal range of motion.  Lymphadenopathy:     Cervical: No cervical adenopathy.  Neurological:     General: No focal deficit present.     Mental Status: She is alert and oriented to person, place, and time.  Psychiatric:        Mood and Affect: Mood normal.       Assessment:      Results for orders placed or performed in visit on 08/03/20  POCT rapid strep A  Result Value Ref Range   Rapid Strep A Screen Negative Negative       Plan:     Marland KitchenMarland Kitchen  Alberto was seen today for sore throat.  Diagnoses and all orders for this visit:  Sore throat -     POCT rapid strep A  Strep negative. Likely viral pharyngitis.  Pt needs to be covid tested fors ure before coming back to work. Symptomatic care with gargling with salt water. Ibuprofen/tylenol/honey tea. Follow up as needed or if symptoms worsen.

## 2020-08-03 NOTE — Progress Notes (Signed)
Please make patient aware

## 2020-08-13 ENCOUNTER — Other Ambulatory Visit: Payer: Self-pay | Admitting: Neurology

## 2020-08-13 MED ORDER — CITALOPRAM HYDROBROMIDE 40 MG PO TABS: 40.0000 mg | ORAL_TABLET | Freq: Every day | ORAL | 0 refills | Status: DC

## 2020-09-20 ENCOUNTER — Ambulatory Visit: Payer: No Typology Code available for payment source | Admitting: Physician Assistant

## 2020-09-24 ENCOUNTER — Ambulatory Visit (INDEPENDENT_AMBULATORY_CARE_PROVIDER_SITE_OTHER): Payer: No Typology Code available for payment source | Admitting: Physician Assistant

## 2020-09-24 DIAGNOSIS — F411 Generalized anxiety disorder: Secondary | ICD-10-CM

## 2020-09-24 DIAGNOSIS — R5383 Other fatigue: Secondary | ICD-10-CM

## 2020-09-24 DIAGNOSIS — D508 Other iron deficiency anemias: Secondary | ICD-10-CM

## 2020-09-24 DIAGNOSIS — Z6841 Body Mass Index (BMI) 40.0 and over, adult: Secondary | ICD-10-CM

## 2020-09-24 DIAGNOSIS — L219 Seborrheic dermatitis, unspecified: Secondary | ICD-10-CM

## 2020-09-24 MED ORDER — KETOCONAZOLE 2 % EX SHAM: 1.0000 "application " | MEDICATED_SHAMPOO | CUTANEOUS | 1 refills | Status: DC

## 2020-09-24 NOTE — Progress Notes (Signed)
Subjective:    Patient ID: Courtney Bailey, female    DOB: 15-Jun-1985, 35 y.o.   MRN: [id number]  HPI Patient is a 34 year old obese female who is breast-feeding 10-month-old child and would like to discuss medications and weight.  Patient is frustrated with her current weight.  She wonders if there are any weight loss medications she could try.  She is not currently exercising.  Patient does feel like her mood and anxiety is well controlled.  She would like to try decreasing Celexa.  She continues to have some daily itchy scalp.  Ketoconazole did help but she ran out of it.  She needs refills.  She continues to take iron.  She wants to make sure her iron levels are good.  She is frustrated with not having any energy.  She does have a 87-month-old she still wakes up at night.  She does admit to snoring.  .. Active Ambulatory Problems    Diagnosis Date Noted   Class 3 severe obesity due to excess calories without serious comorbidity with body mass index (BMI) of 40.0 to 44.9 in adult (HCC) 12/17/2017   Seborrheic dermatitis of scalp 12/17/2017   Itchy scalp 12/17/2017   Eyelid dermatitis, allergic/contact, right 12/17/2017   Elevated fasting glucose 12/18/2017   Dyslipidemia (high LDL; low HDL) 12/18/2017   Pre-diabetes 12/21/2017   Perceived hearing changes 01/04/2018   ETD (Eustachian tube dysfunction), bilateral 01/04/2018   Depression, recurrent (HCC) 02/19/2018   Anxiety 02/19/2018   Insomnia 02/19/2018   Seasonal allergies 04/30/2018   Seasonal allergic rhinitis due to pollen 04/30/2018   Allergic conjunctivitis of both eyes 04/30/2018   Spotting between menses 05/14/2018   Breast mass, right 11/22/2018   Frequent headaches 02/21/2019   Generalized anxiety disorder 02/27/2019   Inattention 03/05/2019   Left lumbar radiculitis 04/01/2019   Family history of blood clots 04/18/2019   Pain of left calf 04/18/2019   Abnormal glucose in pregnancy, antepartum 07/22/2019    Urinary frequency 09/19/2019   Iron deficiency anemia secondary to inadequate dietary iron intake 09/19/2019   Complicated grief 05/07/2020   Post partum depression 05/07/2020   No energy 09/27/2020   Resolved Ambulatory Problems    Diagnosis Date Noted   [redacted] weeks gestation of pregnancy 09/19/2019   Past Medical History:  Diagnosis Date   Disc degeneration, lumbar    Sleep difficulties    Weight gain     Review of Systems See HPI.     Objective:   Physical Exam Constitutional:      Appearance: Normal appearance. She is obese.  HENT:     Head: Normocephalic.     Comments: Fine scales around hairline.  Neck:     Comments: No thyroid enlargement.  Cardiovascular:     Rate and Rhythm: Normal rate and regular rhythm.     Pulses: Normal pulses.  Pulmonary:     Effort: Pulmonary effort is normal.     Breath sounds: Normal breath sounds.  Musculoskeletal:     Cervical back: Normal range of motion and neck supple.  Neurological:     General: No focal deficit present.     Mental Status: She is alert.  Psychiatric:        Mood and Affect: Mood normal.      .. Depression screen [facility] 2/9 09/24/2020 05/05/2020 04/16/2019 09/25/2018 12/17/2017  Decreased Interest 1 1 0 0 0  Down, Depressed, Hopeless 0 1 - 0 0  PHQ - 2 Score 1 2 0  0 0  Altered sleeping 0 0 - 2 1  Tired, decreased energy 1 1 - 0 0  Change in appetite 1 1 - 0 0  Feeling bad or failure about yourself  0 0 - 0 0  Trouble concentrating 1 0 - 0 1  Moving slowly or fidgety/restless 0 0 - 0 0  Suicidal thoughts 0 0 - 0 0  PHQ-9 Score 4 4 - 2 2  Difficult doing work/chores Not difficult at all Somewhat difficult - Somewhat difficult Not difficult at all   .Marland Kitchen GAD 7 : Generalized Anxiety Score 09/24/2020 05/05/2020 09/25/2018 12/17/2017  Nervous, Anxious, on Edge 0 1 0 1  Control/stop worrying 0 1 0 0  Worry too much - different things 1 1 0 0  Trouble relaxing 0 0 1 1  Restless 0 0 0 0  Easily annoyed or irritable 0 0  0 0  Afraid - awful might happen 0 1 0 0  Total GAD 7 Score 1 4 1 2   Anxiety Difficulty Not difficult at all Somewhat difficult Somewhat difficult Not difficult at all        Assessment & Plan:  . Aylee was seen today for follow-up.  Diagnoses and all orders for this visit:  Class 3 severe obesity due to excess calories without serious comorbidity with body mass index (BMI) of 40.0 to 44.9 in adult (HCC) -     Fe+TIBC+Fer -     CBC w/Diff/Platelet -     TSH -     VITAMIN D 25 Hydroxy (Vit-D Deficiency, Fractures) -     COMPLETE METABOLIC PANEL WITH GFR  No energy -     Fe+TIBC+Fer -     CBC w/Diff/Platelet -     TSH -     VITAMIN D 25 Hydroxy (Vit-D Deficiency, Fractures) -     COMPLETE METABOLIC PANEL WITH GFR  Generalized anxiety disorder  Iron deficiency anemia secondary to inadequate dietary iron intake -     Fe+TIBC+Fer -     CBC w/Diff/Platelet  Seborrheic dermatitis of scalp -     ketoconazole (NIZORAL) 2 % shampoo; Apply 1 application topically 2 (two) times a week.   Do not recommend medications for weight loss until finished breast feeding. Continue to work on walking/exercising daily and avoiding sugars. Follow up after done breast feeding.   Will get labs to evaluate fatigue and hx of IDA. Consider sleep study.   Ok to taper off celexa if feeling like anxiety is well controlled. Cut back to 20mg  for next 4 weeks then lets follow up and see how anxiety is doing.   Ketoconazole shampoo twice a week with selsun blue daily. Follow up as needed or if symptoms persist.

## 2020-09-27 ENCOUNTER — Encounter: Payer: Self-pay | Admitting: Physician Assistant

## 2020-09-27 DIAGNOSIS — R5383 Other fatigue: Secondary | ICD-10-CM | POA: Insufficient documentation

## 2020-09-29 ENCOUNTER — Ambulatory Visit (INDEPENDENT_AMBULATORY_CARE_PROVIDER_SITE_OTHER): Payer: No Typology Code available for payment source | Admitting: Physician Assistant

## 2020-09-29 ENCOUNTER — Encounter: Payer: Self-pay | Admitting: Physician Assistant

## 2020-09-29 VITALS — Ht 67.0 in | Wt 274.0 lb

## 2020-09-29 DIAGNOSIS — R748 Abnormal levels of other serum enzymes: Secondary | ICD-10-CM

## 2020-09-29 DIAGNOSIS — E119 Type 2 diabetes mellitus without complications: Secondary | ICD-10-CM | POA: Insufficient documentation

## 2020-09-29 DIAGNOSIS — Z6841 Body Mass Index (BMI) 40.0 and over, adult: Secondary | ICD-10-CM

## 2020-09-29 DIAGNOSIS — E1165 Type 2 diabetes mellitus with hyperglycemia: Secondary | ICD-10-CM | POA: Insufficient documentation

## 2020-09-29 DIAGNOSIS — R739 Hyperglycemia, unspecified: Secondary | ICD-10-CM

## 2020-09-29 LAB — POCT GLYCOSYLATED HEMOGLOBIN (HGB A1C): Hemoglobin A1C: 8.1 % — AB (ref 4.0–5.6)

## 2020-09-29 MED ORDER — FREESTYLE LIBRE 14 DAY SENSOR MISC: 1.0000 "application " | 11 refills | Status: DC

## 2020-09-29 MED ORDER — METFORMIN HCL 1000 MG PO TABS: 1000.0000 mg | ORAL_TABLET | Freq: Two times a day (BID) | ORAL | 0 refills | Status: DC

## 2020-09-29 NOTE — Progress Notes (Deleted)
poct

## 2020-09-29 NOTE — Progress Notes (Signed)
l °

## 2020-09-29 NOTE — Progress Notes (Signed)
Iron and iron stores look great.  Thyroid looks perfect Where you fasting? Your random sugar is a little high. Lets get an a1c today.  Elevated liver enzymes today. Are you taking tylenol regularly? Your not drinking any alcohol right?  JJ see if we can add hepatitis panel, GGT and abdominal ultrasound.

## 2020-09-29 NOTE — Progress Notes (Signed)
Subjective:    Patient ID: Courtney Bailey, female    DOB: 11/04/1985, 35 y.o.   MRN: [id number]  HPI Pt presented to the clinic to discuss A1C of 8.1 after dx of elevated liver enzymes.   She admits to fatigue, increased thirst, increased urination.   .. Active Ambulatory Problems    Diagnosis Date Noted   Class 3 severe obesity due to excess calories without serious comorbidity with body mass index (BMI) of 40.0 to 44.9 in adult (HCC) 12/17/2017   Seborrheic dermatitis of scalp 12/17/2017   Itchy scalp 12/17/2017   Eyelid dermatitis, allergic/contact, right 12/17/2017   Elevated fasting glucose 12/18/2017   Dyslipidemia (high LDL; low HDL) 12/18/2017   Pre-diabetes 12/21/2017   Perceived hearing changes 01/04/2018   ETD (Eustachian tube dysfunction), bilateral 01/04/2018   Depression, recurrent (HCC) 02/19/2018   Anxiety 02/19/2018   Insomnia 02/19/2018   Seasonal allergies 04/30/2018   Seasonal allergic rhinitis due to pollen 04/30/2018   Allergic conjunctivitis of both eyes 04/30/2018   Spotting between menses 05/14/2018   Breast mass, right 11/22/2018   Frequent headaches 02/21/2019   Generalized anxiety disorder 02/27/2019   Inattention 03/05/2019   Left lumbar radiculitis 04/01/2019   Family history of blood clots 04/18/2019   Pain of left calf 04/18/2019   Abnormal glucose in pregnancy, antepartum 07/22/2019   Urinary frequency 09/19/2019   Iron deficiency anemia secondary to inadequate dietary iron intake 09/19/2019   Complicated grief 05/07/2020   Post partum depression 05/07/2020   No energy 09/27/2020   Type 2 diabetes mellitus with hyperglycemia, without long-term current use of insulin (HCC) 09/29/2020   Resolved Ambulatory Problems    Diagnosis Date Noted   [redacted] weeks gestation of pregnancy 09/19/2019   Past Medical History:  Diagnosis Date   Disc degeneration, lumbar    Sleep difficulties    Weight gain         Review of Systems    See HPI.   Objective:   Physical Exam    .. Results for orders placed or performed in visit on 09/29/20  POCT glycosylated hemoglobin (Hb A1C)  Result Value Ref Range   Hemoglobin A1C 8.1 (A) 4.0 - 5.6 %   HbA1c POC (<> result, manual entry)     HbA1c, POC (prediabetic range)     HbA1c, POC (controlled diabetic range)         Assessment & Plan:  .Marland KitchenLovinia was seen today for follow-up.  Diagnoses and all orders for this visit:  Uncontrolled type 2 diabetes mellitus with hyperglycemia (HCC) -     metFORMIN (GLUCOPHAGE) 1000 MG tablet; Take 1 tablet (1,000 mg total) by mouth 2 (two) times daily with a meal. -     Continuous Blood Gluc Sensor (FREESTYLE LIBRE 14 DAY SENSOR) MISC; 1 application by Does not apply route every 14 (fourteen) days. Apply upper deltoid every 14 days, use reader to determine blood sugars -     Ambulatory referral to diabetic education  Blood glucose elevated -     POCT glycosylated hemoglobin (Hb A1C)  Elevated liver enzymes  Class 3 severe obesity due to excess calories without serious comorbidity with body mass index (BMI) of 40.0 to 44.9 in adult [facility])  Discussed new diagnosis and risk.  Pt is breast feeding.  Nutrition and weight loss are the most important thing you can do.  Printed nutrition information.  Start metformin. Discussed side effects.  Nutrition referral made.  Freestyle meter attached today. Sent to  pharmacy. Discussed fasting goals. Follow up in 3 months.   Spent 30 minutes with patient discussing dx, risk, medications.

## 2020-09-29 NOTE — Patient Instructions (Signed)
Diabetes Mellitus and Nutrition, Adult When you have diabetes, or diabetes mellitus, it is very important to have healthy eating habits because your blood sugar (glucose) levels are greatly affected by what you eat and drink. Eating healthy foods in the right amounts, at about the same times every day, can help you:  Control your blood glucose.  Lower your risk of heart disease.  Improve your blood pressure.  Reach or maintain a healthy weight. What can affect my meal plan? Every person with diabetes is different, and each person has different needs for a meal plan. Your health care provider may recommend that you work with a dietitian to make a meal plan that is best for you. Your meal plan may vary depending on factors such as:  The calories you need.  The medicines you take.  Your weight.  Your blood glucose, blood pressure, and cholesterol levels.  Your activity level.  Other health conditions you have, such as heart or kidney disease. How do carbohydrates affect me? Carbohydrates, also called carbs, affect your blood glucose level more than any other type of food. Eating carbs naturally raises the amount of glucose in your blood. Carb counting is a method for keeping track of how many carbs you eat. Counting carbs is important to keep your blood glucose at a healthy level, especially if you use insulin or take certain oral diabetes medicines. It is important to know how many carbs you can safely have in each meal. This is different for every person. Your dietitian can help you calculate how many carbs you should have at each meal and for each snack. How does alcohol affect me? Alcohol can cause a sudden decrease in blood glucose (hypoglycemia), especially if you use insulin or take certain oral diabetes medicines. Hypoglycemia can be a life-threatening condition. Symptoms of hypoglycemia, such as sleepiness, dizziness, and confusion, are similar to symptoms of having too much  alcohol.  Do not drink alcohol if: ? Your health care provider tells you not to drink. ? You are pregnant, may be pregnant, or are planning to become pregnant.  If you drink alcohol: ? Do not drink on an empty stomach. ? Limit how much you use to:  0-1 drink a day for women.  0-2 drinks a day for men. ? Be aware of how much alcohol is in your drink. In the [place] one drink equals one 12 oz bottle of beer (355 mL), one 5 oz glass of wine (148 mL), or one 1 oz glass of hard liquor (44 mL). ? Keep yourself hydrated with water, diet soda, or unsweetened iced tea.  Keep in mind that regular soda, juice, and other mixers may contain a lot of sugar and must be counted as carbs. What are tips for following this plan? Reading food labels  Start by checking the serving size on the "Nutrition Facts" label of packaged foods and drinks. The amount of calories, carbs, fats, and other nutrients listed on the label is based on one serving of the item. Many items contain more than one serving per package.  Check the total grams (g) of carbs in one serving. You can calculate the number of servings of carbs in one serving by dividing the total carbs by 15. For example, if a food has 30 g of total carbs per serving, it would be equal to 2 servings of carbs.  Check the number of grams (g) of saturated fats and trans fats in one serving. Choose foods that have   a low amount or none of these fats.  Check the number of milligrams (mg) of salt (sodium) in one serving. Most people should limit total sodium intake to less than 2,300 mg per day.  Always check the nutrition information of foods labeled as "low-fat" or "nonfat." These foods may be higher in added sugar or refined carbs and should be avoided.  Talk to your dietitian to identify your daily goals for nutrients listed on the label. Shopping  Avoid buying canned, pre-made, or processed foods. These foods tend to be high in fat, sodium, and added  sugar.  Shop around the outside edge of the grocery store. This is where you will most often find fresh fruits and vegetables, bulk grains, fresh meats, and fresh dairy. Cooking  Use low-heat cooking methods, such as baking, instead of high-heat cooking methods like deep frying.  Cook using healthy oils, such as olive, canola, or sunflower oil.  Avoid cooking with butter, cream, or high-fat meats. Meal planning  Eat meals and snacks regularly, preferably at the same times every day. Avoid going long periods of time without eating.  Eat foods that are high in fiber, such as fresh fruits, vegetables, beans, and whole grains. Talk with your dietitian about how many servings of carbs you can eat at each meal.  Eat 4-6 oz (112-168 g) of lean protein each day, such as lean meat, chicken, fish, eggs, or tofu. One ounce (oz) of lean protein is equal to: ? 1 oz (28 g) of meat, chicken, or fish. ? 1 egg. ?  cup (62 g) of tofu.  Eat some foods each day that contain healthy fats, such as avocado, nuts, seeds, and fish.   What foods should I eat? Fruits Berries. Apples. Oranges. Peaches. Apricots. Plums. Grapes. Mango. Papaya. Pomegranate. Kiwi. Cherries. Vegetables Lettuce. Spinach. Leafy greens, including kale, chard, collard greens, and mustard greens. Beets. Cauliflower. Cabbage. Broccoli. Carrots. Green beans. Tomatoes. Peppers. Onions. Cucumbers. Brussels sprouts. Grains Whole grains, such as whole-wheat or whole-grain bread, crackers, tortillas, cereal, and pasta. Unsweetened oatmeal. Quinoa. Brown or wild rice. Meats and other proteins Seafood. Poultry without skin. Lean cuts of poultry and beef. Tofu. Nuts. Seeds. Dairy Low-fat or fat-free dairy products such as milk, yogurt, and cheese. The items listed above may not be a complete list of foods and beverages you can eat. Contact a dietitian for more information. What foods should I avoid? Fruits Fruits canned with  syrup. Vegetables Canned vegetables. Frozen vegetables with butter or cream sauce. Grains Refined white flour and flour products such as bread, pasta, snack foods, and cereals. Avoid all processed foods. Meats and other proteins Fatty cuts of meat. Poultry with skin. Breaded or fried meats. Processed meat. Avoid saturated fats. Dairy Full-fat yogurt, cheese, or milk. Beverages Sweetened drinks, such as soda or iced tea. The items listed above may not be a complete list of foods and beverages you should avoid. Contact a dietitian for more information. Questions to ask a health care provider  Do I need to meet with a diabetes educator?  Do I need to meet with a dietitian?  What number can I call if I have questions?  When are the best times to check my blood glucose? Where to find more information:  American Diabetes Association: diabetes.org  Academy of Nutrition and Dietetics: www.eatright.org  National Institute of Diabetes and Digestive and Kidney Diseases: www.niddk.nih.gov  Association of Diabetes Care and Education Specialists: www.diabeteseducator.org Summary  It is important to have healthy eating   habits because your blood sugar (glucose) levels are greatly affected by what you eat and drink.  A healthy meal plan will help you control your blood glucose and maintain a healthy lifestyle.  Your health care provider may recommend that you work with a dietitian to make a meal plan that is best for you.  Keep in mind that carbohydrates (carbs) and alcohol have immediate effects on your blood glucose levels. It is important to count carbs and to use alcohol carefully. This information is not intended to replace advice given to you by your health care provider. Make sure you discuss any questions you have with your health care provider. Document Revised: 12/24/2018 Document Reviewed: 12/24/2018 Elsevier Patient Education  2021 Elsevier Inc.  

## 2020-10-03 LAB — CBC WITH DIFFERENTIAL/PLATELET
Absolute Monocytes: 454 cells/uL (ref 200–950)
Basophils Absolute: 50 cells/uL (ref 0–200)
Basophils Relative: 0.7 %
Eosinophils Absolute: 374 cells/uL (ref 15–500)
Eosinophils Relative: 5.2 %
HCT: 41.3 % (ref 35.0–45.0)
Hemoglobin: 13.6 g/dL (ref 11.7–15.5)
Lymphs Abs: 1994 cells/uL (ref 850–3900)
MCH: 29.8 pg (ref 27.0–33.0)
MCHC: 32.9 g/dL (ref 32.0–36.0)
MCV: 90.4 fL (ref 80.0–100.0)
MPV: 10.8 fL (ref 7.5–12.5)
Monocytes Relative: 6.3 %
Neutro Abs: 4327 cells/uL (ref 1500–7800)
Neutrophils Relative %: 60.1 %
Platelets: 285 10*3/uL (ref 140–400)
RBC: 4.57 10*6/uL (ref 3.80–5.10)
RDW: 13.3 % (ref 11.0–15.0)
Total Lymphocyte: 27.7 %
WBC: 7.2 10*3/uL (ref 3.8–10.8)

## 2020-10-03 LAB — COMPLETE METABOLIC PANEL WITH GFR
AG Ratio: 1.5 (calc) (ref 1.0–2.5)
ALT: 71 U/L — ABNORMAL HIGH (ref 6–29)
AST: 53 U/L — ABNORMAL HIGH (ref 10–30)
Albumin: 4.4 g/dL (ref 3.6–5.1)
Alkaline phosphatase (APISO): 115 U/L (ref 31–125)
BUN: 13 mg/dL (ref 7–25)
CO2: 24 mmol/L (ref 20–32)
Calcium: 9.5 mg/dL (ref 8.6–10.2)
Chloride: 100 mmol/L (ref 98–110)
Creat: 0.84 mg/dL (ref 0.50–0.97)
Globulin: 3 g/dL (calc) (ref 1.9–3.7)
Glucose, Bld: 149 mg/dL — ABNORMAL HIGH (ref 65–99)
Potassium: 4.2 mmol/L (ref 3.5–5.3)
Sodium: 135 mmol/L (ref 135–146)
Total Bilirubin: 0.4 mg/dL (ref 0.2–1.2)
Total Protein: 7.4 g/dL (ref 6.1–8.1)
eGFR: 94 mL/min/{1.73_m2} (ref >=60)

## 2020-10-03 LAB — REFLEX TIQ

## 2020-10-03 LAB — ACUTE HEP PANEL AND HEP B SURFACE AB
HEPATITIS C ANTIBODY REFILL$(REFL): NONREACTIVE
Hep A IgM: NONREACTIVE
Hep B C IgM: NONREACTIVE
Hepatitis B Surface Ag: NONREACTIVE
SIGNAL TO CUT-OFF: 0.04 (ref <1.00)

## 2020-10-03 LAB — TEST AUTHORIZATION

## 2020-10-03 LAB — IRON,TIBC AND FERRITIN PANEL
%SAT: 31 % (calc) (ref 16–45)
Ferritin: 61 ng/mL (ref 16–154)
Iron: 104 ug/dL (ref 40–190)
TIBC: 336 mcg/dL (calc) (ref 250–450)

## 2020-10-03 LAB — GAMMA GT: GGT: 28 U/L (ref 3–50)

## 2020-10-03 LAB — VITAMIN D 25 HYDROXY (VIT D DEFICIENCY, FRACTURES): Vit D, 25-Hydroxy: 27 ng/mL — ABNORMAL LOW (ref 30–100)

## 2020-10-03 LAB — TSH: TSH: 1.09 mIU/L

## 2020-10-03 LAB — HCG, TOTAL, QUANTITATIVE: hCG, Beta Chain, Quant, S: <3 m[IU]/mL

## 2020-10-05 NOTE — Progress Notes (Signed)
Confirmed not pregnancy and negative hepatitis panel.

## 2020-12-07 ENCOUNTER — Other Ambulatory Visit: Payer: Self-pay | Admitting: Physician Assistant

## 2020-12-07 MED ORDER — METFORMIN HCL 500 MG PO TABS: 500.0000 mg | ORAL_TABLET | Freq: Two times a day (BID) | ORAL | 1 refills | Status: DC

## 2020-12-14 ENCOUNTER — Other Ambulatory Visit: Payer: Self-pay | Admitting: Physician Assistant

## 2020-12-14 MED ORDER — CLOTRIMAZOLE 1 % EX CREA: 1.0000 "application " | TOPICAL_CREAM | Freq: Two times a day (BID) | CUTANEOUS | 0 refills | Status: DC

## 2020-12-17 ENCOUNTER — Other Ambulatory Visit: Payer: Self-pay | Admitting: Physician Assistant

## 2020-12-17 MED ORDER — METFORMIN HCL ER 500 MG PO TB24: 500.0000 mg | ORAL_TABLET | Freq: Every day | ORAL | 5 refills | Status: DC

## 2020-12-29 ENCOUNTER — Ambulatory Visit (INDEPENDENT_AMBULATORY_CARE_PROVIDER_SITE_OTHER): Payer: No Typology Code available for payment source | Admitting: Physician Assistant

## 2020-12-29 ENCOUNTER — Encounter: Payer: Self-pay | Admitting: Physician Assistant

## 2020-12-29 ENCOUNTER — Other Ambulatory Visit: Payer: Self-pay

## 2020-12-29 VITALS — BP 125/73 | HR 85 | Ht 67.0 in | Wt 263.0 lb

## 2020-12-29 DIAGNOSIS — Z6841 Body Mass Index (BMI) 40.0 and over, adult: Secondary | ICD-10-CM

## 2020-12-29 DIAGNOSIS — E1165 Type 2 diabetes mellitus with hyperglycemia: Secondary | ICD-10-CM

## 2020-12-29 DIAGNOSIS — L853 Xerosis cutis: Secondary | ICD-10-CM

## 2020-12-29 DIAGNOSIS — E1169 Type 2 diabetes mellitus with other specified complication: Secondary | ICD-10-CM | POA: Diagnosis not present

## 2020-12-29 DIAGNOSIS — R748 Abnormal levels of other serum enzymes: Secondary | ICD-10-CM | POA: Diagnosis not present

## 2020-12-29 DIAGNOSIS — F411 Generalized anxiety disorder: Secondary | ICD-10-CM

## 2020-12-29 LAB — POCT GLYCOSYLATED HEMOGLOBIN (HGB A1C): Hemoglobin A1C: 6.6 % — AB (ref 4.0–5.6)

## 2020-12-29 MED ORDER — CITALOPRAM HYDROBROMIDE 10 MG PO TABS
10.0000 mg | ORAL_TABLET | Freq: Every day | ORAL | 3 refills | Status: DC
Start: 2020-12-29 — End: 2021-03-30

## 2020-12-29 NOTE — Progress Notes (Signed)
Subjective:    Patient ID: Courtney Bailey, female    DOB: 1985-08-08, 35 y.o.   MRN: [id number]  HPI Pt is a 35 yo obese female with T2DM, GAD who presents to the clinic for 3 month follow up.   Last A!C was 8.8. she has made lots of diet changes. She is more active. She is taking metformin. She checks her sugars and hanging out around 100 in am. No hypoglycemic events. No open sores or wounds. No CP, palpitations,headaches, or vision changes. Lost 14lbs in 3 months. Feeling a lot better. Her anxiety is better too. Down to 10mg  of celexa and doing well.   She does have dry scaly heels and wonders what to do.   .. Active Ambulatory Problems    Diagnosis Date Noted   Class 3 severe obesity due to excess calories without serious comorbidity with body mass index (BMI) of 40.0 to 44.9 in adult (HCC) 12/17/2017   Seborrheic dermatitis of scalp 12/17/2017   Itchy scalp 12/17/2017   Eyelid dermatitis, allergic/contact, right 12/17/2017   Elevated fasting glucose 12/18/2017   Dyslipidemia (high LDL; low HDL) 12/18/2017   Pre-diabetes 12/21/2017   Perceived hearing changes 01/04/2018   ETD (Eustachian tube dysfunction), bilateral 01/04/2018   Depression, recurrent (HCC) 02/19/2018   Anxiety 02/19/2018   Insomnia 02/19/2018   Seasonal allergies 04/30/2018   Seasonal allergic rhinitis due to pollen 04/30/2018   Allergic conjunctivitis of both eyes 04/30/2018   Spotting between menses 05/14/2018   Breast mass, right 11/22/2018   Frequent headaches 02/21/2019   Generalized anxiety disorder 02/27/2019   Inattention 03/05/2019   Left lumbar radiculitis 04/01/2019   Family history of blood clots 04/18/2019   Pain of left calf 04/18/2019   Abnormal glucose in pregnancy, antepartum 07/22/2019   Urinary frequency 09/19/2019   Iron deficiency anemia secondary to inadequate dietary iron intake 09/19/2019   Complicated grief 05/07/2020   Post partum depression 05/07/2020   No energy 09/27/2020    Diabetes mellitus (HCC) 09/29/2020   Dry skin 12/29/2020   Elevated liver enzymes 12/29/2020   Resolved Ambulatory Problems    Diagnosis Date Noted   [redacted] weeks gestation of pregnancy 09/19/2019   Past Medical History:  Diagnosis Date   Disc degeneration, lumbar    Sleep difficulties    Weight gain      Review of Systems See HPI.     Objective:   Physical Exam Vitals reviewed.  Constitutional:      Appearance: Normal appearance. She is obese.  HENT:     Head: Normocephalic.  Cardiovascular:     Rate and Rhythm: Normal rate and regular rhythm.     Pulses: Normal pulses.     Heart sounds: Normal heart sounds.  Pulmonary:     Effort: Pulmonary effort is normal.     Breath sounds: Normal breath sounds.  Musculoskeletal:     Right lower leg: No edema.     Left lower leg: No edema.  Skin:    Comments: Bilateral dry scaly heels  Neurological:     General: No focal deficit present.     Mental Status: She is alert.  Psychiatric:        Mood and Affect: Mood normal.          Assessment & Plan:  .08/22/2021Jazarah was seen today for diabetes.  Diagnoses and all orders for this visit:  Type 2 diabetes mellitus with other specified complication, without long-term current use of insulin (HCC) -  POCT glycosylated hemoglobin (Hb A1C)  Elevated liver enzymes -     COMPLETE METABOLIC PANEL WITH GFR  Class 3 severe obesity due to excess calories without serious comorbidity with body mass index (BMI) of 40.0 to 44.9 in adult (HCC)  Dry skin  GAD (generalized anxiety disorder) -     citalopram (CELEXA) 10 MG tablet; Take 1 tablet (10 mg total) by mouth daily.  A1C to goal.  Continue to work on diet/exercise.  Stay on metformin.  Not on statin due to BF and child bearing potential BP to goal  Flu shot UTD.  Declines covid vaccine  Recheck cmp and liver enzymes hopefully the 14lbs weight loss will help this  Refilled celexa for mood.   Discussed pummel stone and  good moisturizer for feet.

## 2020-12-30 LAB — COMPLETE METABOLIC PANEL WITH GFR
AG Ratio: 1.6 (calc) (ref 1.0–2.5)
ALT: 36 U/L — ABNORMAL HIGH (ref 6–29)
AST: 27 U/L (ref 10–30)
Albumin: 4.6 g/dL (ref 3.6–5.1)
Alkaline phosphatase (APISO): 110 U/L (ref 31–125)
BUN: 11 mg/dL (ref 7–25)
CO2: 21 mmol/L (ref 20–32)
Calcium: 9.5 mg/dL (ref 8.6–10.2)
Chloride: 105 mmol/L (ref 98–110)
Creat: 0.74 mg/dL (ref 0.50–0.97)
Globulin: 2.9 g/dL (calc) (ref 1.9–3.7)
Glucose, Bld: 145 mg/dL — ABNORMAL HIGH (ref 65–99)
Potassium: 4.3 mmol/L (ref 3.5–5.3)
Sodium: 139 mmol/L (ref 135–146)
Total Bilirubin: 0.6 mg/dL (ref 0.2–1.2)
Total Protein: 7.5 g/dL (ref 6.1–8.1)
eGFR: 109 mL/min/{1.73_m2} (ref >=60)

## 2020-12-31 NOTE — Progress Notes (Signed)
Liver enzymes improving with weight loss! Almost back to normal. Continue working on glucose control and weight loss.

## 2021-03-30 ENCOUNTER — Encounter: Payer: Self-pay | Admitting: Physician Assistant

## 2021-03-30 ENCOUNTER — Telehealth (INDEPENDENT_AMBULATORY_CARE_PROVIDER_SITE_OTHER): Payer: No Typology Code available for payment source | Admitting: Physician Assistant

## 2021-03-30 VITALS — Temp 97.3°F | Ht 67.0 in | Wt 255.0 lb

## 2021-03-30 DIAGNOSIS — F411 Generalized anxiety disorder: Secondary | ICD-10-CM

## 2021-03-30 DIAGNOSIS — R748 Abnormal levels of other serum enzymes: Secondary | ICD-10-CM | POA: Diagnosis not present

## 2021-03-30 DIAGNOSIS — E66812 Obesity, class 2: Secondary | ICD-10-CM

## 2021-03-30 DIAGNOSIS — Z6839 Body mass index (BMI) 39.0-39.9, adult: Secondary | ICD-10-CM

## 2021-03-30 DIAGNOSIS — E1169 Type 2 diabetes mellitus with other specified complication: Secondary | ICD-10-CM

## 2021-03-30 DIAGNOSIS — E785 Hyperlipidemia, unspecified: Secondary | ICD-10-CM

## 2021-03-30 MED ORDER — OZEMPIC (0.25 OR 0.5 MG/DOSE) 2 MG/1.5ML ~~LOC~~ SOPN: 0.2500 mg | PEN_INJECTOR | SUBCUTANEOUS | 0 refills | Status: DC

## 2021-03-30 NOTE — Progress Notes (Signed)
..Virtual Visit via Video Note ? ?I connected with Courtney Bailey on 03/30/21 at  7:50 AM EST by a video enabled telemedicine application and verified that I am speaking with the correct person using two identifiers. ? ?Location: ?Patient: home ?Provider: clinic ? ?Marland Kitchen.Participating in visit:  ?Patient: Courtney Bailey ?Provider: Iran Planas PA-C ?Provider in training: Lyndle Herrlich PA-S ?  ?I discussed the limitations of evaluation and management by telemedicine and the availability of in person appointments. The patient expressed understanding and agreed to proceed. ? ?History of Present Illness: ?Pt is a 36 yo obese female with T2DM, HLD, elevated liver enzymes, GAD who needs refills and follow up.  ? ?She is not checking her sugars right now. No hypoglycemic events. No open sores or wounds. No CP, palpitations, headaches. She is working on diet and weight loss. She is down to 255 today from 263 3 months ago.  ? ?Her mood is controlled. She wants to go off celexa.  ? ?.. ?Active Ambulatory Problems  ?  Diagnosis Date Noted  ? Class 2 severe obesity due to excess calories with serious comorbidity and body mass index (BMI) of 39.0 to 39.9 in adult [facility]) 12/17/2017  ? Seborrheic dermatitis of scalp 12/17/2017  ? Itchy scalp 12/17/2017  ? Eyelid dermatitis, allergic/contact, right 12/17/2017  ? Elevated fasting glucose 12/18/2017  ? Dyslipidemia, goal LDL below 70 12/18/2017  ? Perceived hearing changes 01/04/2018  ? ETD (Eustachian tube dysfunction), bilateral 01/04/2018  ? Depression, recurrent ([place]) 02/19/2018  ? Anxiety 02/19/2018  ? Insomnia 02/19/2018  ? Seasonal allergies 04/30/2018  ? Seasonal allergic rhinitis due to pollen 04/30/2018  ? Allergic conjunctivitis of both eyes 04/30/2018  ? Breast mass, right 11/22/2018  ? Frequent headaches 02/21/2019  ? Generalized anxiety disorder 02/27/2019  ? Inattention 03/05/2019  ? Left lumbar radiculitis 04/01/2019  ? Family history of blood clots 04/18/2019  ? Pain of left calf  04/18/2019  ? Abnormal glucose in pregnancy, antepartum 07/22/2019  ? Urinary frequency 09/19/2019  ? Iron deficiency anemia secondary to inadequate dietary iron intake 09/19/2019  ? Complicated grief [id number]  ? Post partum depression 05/07/2020  ? No energy 09/27/2020  ? Diabetes mellitus ([place]) 09/29/2020  ? Dry skin 12/29/2020  ? Elevated liver enzymes 12/29/2020  ? ?Resolved Ambulatory Problems  ?  Diagnosis Date Noted  ? Pre-diabetes 12/21/2017  ? Spotting between menses 05/14/2018  ? [redacted] weeks gestation of pregnancy 09/19/2019  ? ?Past Medical History:  ?Diagnosis Date  ? Disc degeneration, lumbar   ? Sleep difficulties   ? Weight gain   ? ? ? ?  ?Observations/Objective: ?No acute distress ?Normal mood and appearance ?Normal breathing ? ?.. ?Today's Vitals  ? 03/30/21 0735  ?Temp: (!) 97.3 ?F (36.3 ?C)  ?TempSrc: Oral  ?Weight: 255 lb (115.7 kg)  ?Height: 5\' 7"  (1.702 m)  ? ?Body mass index is 39.94 kg/m?. ? ?Assessment and Plan: ?..[name] was seen today for follow-up. ? ?Diagnoses and all orders for this visit: ? ?Type 2 diabetes mellitus with other specified complication, without long-term current use of insulin ([place]) ?-     Hemoglobin A1c ?-     COMPLETE METABOLIC PANEL WITH GFR ?-     Semaglutide,0.25 or 0.5MG /DOS, (OZEMPIC, 0.25 OR 0.5 MG/DOSE,) 2 MG/1.5ML SOPN; Inject 0.25 mg into the skin once a week. ? ?Elevated liver enzymes ?-     COMPLETE METABOLIC PANEL WITH GFR ? ?Dyslipidemia, goal LDL below 70 ?-     Lipid Panel w/reflex Direct LDL ? ?  Class 2 severe obesity due to excess calories with serious comorbidity and body mass index (BMI) of 39.0 to 39.9 in adult [facility]) ? ? ?Needs labs for A1C and to follow up on liver enzyme elevation ?Stay on metformin.  ?Added ozempic for weight loss and diabetic control ?Not on statin due to BF. ?Pt will have to stop BF before starting ozempic and she is aware of that. ?Discussed SE and how to use.  ?Needs eye exam.  ?Follow up in 3 months.  ? ?..Discussed low carb  diet with 1500 calories and 80g of protein.  ?Exercising at least 150 minutes a week.  ?My Fitness Pal could be a Microbiologist.  ? ?Stop celexa. Follow up as needed for mood.  ? ? ? ?Follow Up Instructions: ? ?  ?I discussed the assessment and treatment plan with the patient. The patient was provided an opportunity to ask questions and all were answered. The patient agreed with the plan and demonstrated an understanding of the instructions. ?  ?The patient was advised to call back or seek an in-person evaluation if the symptoms worsen or if the condition fails to improve as anticipated. ? ? ? ?Iran Planas, PA-C ? ?

## 2021-03-31 LAB — LIPID PANEL W/REFLEX DIRECT LDL
Cholesterol: 252 mg/dL — ABNORMAL HIGH (ref <200)
HDL: 43 mg/dL — ABNORMAL LOW (ref >=50)
LDL Cholesterol (Calc): 182 mg/dL (calc) — ABNORMAL HIGH
Non-HDL Cholesterol (Calc): 209 mg/dL (calc) — ABNORMAL HIGH (ref <130)
Total CHOL/HDL Ratio: 5.9 (calc) — ABNORMAL HIGH (ref <5.0)
Triglycerides: 132 mg/dL (ref <150)

## 2021-03-31 LAB — HEMOGLOBIN A1C
Hgb A1c MFr Bld: 6.3 % of total Hgb — ABNORMAL HIGH (ref <5.7)
Mean Plasma Glucose: 134 mg/dL
eAG (mmol/L): 7.4 mmol/L

## 2021-03-31 LAB — COMPLETE METABOLIC PANEL WITH GFR
AG Ratio: 1.7 (calc) (ref 1.0–2.5)
ALT: 17 U/L (ref 6–29)
AST: 14 U/L (ref 10–30)
Albumin: 4.4 g/dL (ref 3.6–5.1)
Alkaline phosphatase (APISO): 87 U/L (ref 31–125)
BUN: 11 mg/dL (ref 7–25)
CO2: 27 mmol/L (ref 20–32)
Calcium: 9.3 mg/dL (ref 8.6–10.2)
Chloride: 103 mmol/L (ref 98–110)
Creat: 0.68 mg/dL (ref 0.50–0.97)
Globulin: 2.6 g/dL (calc) (ref 1.9–3.7)
Glucose, Bld: 127 mg/dL — ABNORMAL HIGH (ref 65–99)
Potassium: 4.4 mmol/L (ref 3.5–5.3)
Sodium: 136 mmol/L (ref 135–146)
Total Bilirubin: 0.5 mg/dL (ref 0.2–1.2)
Total Protein: 7 g/dL (ref 6.1–8.1)
eGFR: 118 mL/min/{1.73_m2} (ref >=60)

## 2021-03-31 NOTE — Progress Notes (Signed)
A1C is 6.3 and improved.  ?Liver enzymes are MUCH better.  ?Cholesterol is elevated. Need to consider statin but need to not be breast feeding AND cannot be on statin while pregnant.

## 2021-05-12 ENCOUNTER — Other Ambulatory Visit: Payer: Self-pay | Admitting: Neurology

## 2021-05-12 MED ORDER — PX PEN NEEDLE 31G X 8 MM MISC: 1.0000 | 0 refills | Status: DC

## 2021-05-12 NOTE — Telephone Encounter (Signed)
Patient needs needles for Ozempic. RX sent.  ?

## 2021-05-18 ENCOUNTER — Other Ambulatory Visit: Payer: Self-pay | Admitting: Neurology

## 2021-05-18 ENCOUNTER — Telehealth: Payer: Self-pay | Admitting: Physician Assistant

## 2021-05-18 DIAGNOSIS — E1169 Type 2 diabetes mellitus with other specified complication: Secondary | ICD-10-CM

## 2021-05-18 MED ORDER — SEMAGLUTIDE (1 MG/DOSE) 4 MG/3ML ~~LOC~~ SOPN: 0.5000 mg | PEN_INJECTOR | SUBCUTANEOUS | 0 refills | Status: DC

## 2021-06-15 NOTE — Telephone Encounter (Signed)
Error

## 2021-06-16 ENCOUNTER — Other Ambulatory Visit: Payer: Self-pay | Admitting: Physician Assistant

## 2021-06-16 ENCOUNTER — Other Ambulatory Visit (HOSPITAL_BASED_OUTPATIENT_CLINIC_OR_DEPARTMENT_OTHER): Payer: Self-pay

## 2021-06-16 MED ORDER — METFORMIN HCL ER 500 MG PO TB24
ORAL_TABLET | ORAL | 5 refills | Status: DC
  Filled 2021-06-16: qty 30, 30d supply, fill #0

## 2021-06-16 MED ORDER — OZEMPIC (0.25 OR 0.5 MG/DOSE) 2 MG/3ML ~~LOC~~ SOPN
PEN_INJECTOR | SUBCUTANEOUS | 0 refills | Status: DC
  Filled 2021-06-16: qty 3, 28d supply, fill #0

## 2021-07-13 ENCOUNTER — Encounter: Payer: Self-pay | Admitting: Physician Assistant

## 2021-07-13 ENCOUNTER — Ambulatory Visit (INDEPENDENT_AMBULATORY_CARE_PROVIDER_SITE_OTHER): Payer: No Typology Code available for payment source | Admitting: Physician Assistant

## 2021-07-13 ENCOUNTER — Other Ambulatory Visit (HOSPITAL_BASED_OUTPATIENT_CLINIC_OR_DEPARTMENT_OTHER): Payer: Self-pay

## 2021-07-13 VITALS — BP 125/79 | HR 80 | Ht 67.0 in | Wt 249.0 lb

## 2021-07-13 DIAGNOSIS — Z6839 Body mass index (BMI) 39.0-39.9, adult: Secondary | ICD-10-CM

## 2021-07-13 DIAGNOSIS — E1169 Type 2 diabetes mellitus with other specified complication: Secondary | ICD-10-CM

## 2021-07-13 DIAGNOSIS — E785 Hyperlipidemia, unspecified: Secondary | ICD-10-CM

## 2021-07-13 DIAGNOSIS — F419 Anxiety disorder, unspecified: Secondary | ICD-10-CM

## 2021-07-13 LAB — POCT GLYCOSYLATED HEMOGLOBIN (HGB A1C): HbA1c, POC (controlled diabetic range): 5.7 % (ref 0.0–7.0)

## 2021-07-13 MED ORDER — METFORMIN HCL ER 500 MG PO TB24: ORAL_TABLET | ORAL | 3 refills | Status: DC

## 2021-07-13 MED ORDER — METFORMIN HCL ER 500 MG PO TB24
ORAL_TABLET | ORAL | 3 refills | Status: DC
  Filled 2021-07-13: qty 90, 90d supply, fill #0
  Filled 2021-11-07: qty 90, 90d supply, fill #1
  Filled 2022-01-31 – 2022-03-01 (×3): qty 90, 90d supply, fill #2

## 2021-07-13 MED ORDER — SEMAGLUTIDE (1 MG/DOSE) 4 MG/3ML ~~LOC~~ SOPN
1.0000 mg | PEN_INJECTOR | SUBCUTANEOUS | 0 refills | Status: DC
  Filled 2021-07-13: qty 9, 84d supply, fill #0

## 2021-07-13 MED ORDER — SEMAGLUTIDE (1 MG/DOSE) 4 MG/3ML ~~LOC~~ SOPN: 1.0000 mg | PEN_INJECTOR | SUBCUTANEOUS | 0 refills | Status: DC

## 2021-07-13 NOTE — Progress Notes (Signed)
Established Patient Office Visit  Subjective   Patient ID: Courtney Bailey, female    DOB: 12-Jun-1985  Age: 36 y.o. MRN: [id number]  Chief Complaint  Patient presents with   Diabetes    Diabetes Associated symptoms include weight loss (intentional).    Courtney Bailey is a 36 year old female with a history of T2DM, dyslipidemia, depression, anxiety presenting for a 3 month follow up for diabetes management. She reports that she has been doing well overall. She has successfully tapered herself off of Celexa, and reports her mood has been great. She does say that she gets angry occasionally. A1C today was 5.7, down from 6.3. She has lost 15 pounds since November. She is currently taking Ozempic 0.5 mg, but she feels as if it sometimes is not curbing her appetite enough. She continues to take walks outside with her daughter. She reports that she is trying to eat better. She notes that she has been drinking less water and has been slightly more constipated than normal. She endorses some intermittent nausea. Denies any changes with vision, numbness, tingling, chest pain, lower extremity edema.   Review of Systems  Constitutional:  Positive for weight loss (intentional).  Gastrointestinal:  Positive for constipation and nausea (occasional).  All other systems reviewed and are negative.     Objective:     BP 125/79   Pulse 80   Ht 5\' 7"  (1.702 m)   Wt 249 lb (112.9 kg)   SpO2 98%   Breastfeeding No   BMI 39.00 kg/m  BP Readings from Last 3 Encounters:  07/13/21 125/79  12/29/20 125/73  09/24/20 (!) 139/95   Wt Readings from Last 3 Encounters:  07/13/21 249 lb (112.9 kg)  03/30/21 255 lb (115.7 kg)  12/29/20 263 lb (119.3 kg)      Physical Exam Vitals reviewed.  Constitutional:      Appearance: She is obese.  Eyes:     Extraocular Movements: Extraocular movements intact.     Conjunctiva/sclera: Conjunctivae normal.  Cardiovascular:     Rate and Rhythm: Normal rate  and regular rhythm.     Pulses: Normal pulses.     Heart sounds: Normal heart sounds.  Pulmonary:     Effort: Pulmonary effort is normal.     Breath sounds: Normal breath sounds.  Abdominal:     General: Bowel sounds are normal. There is no distension.     Palpations: Abdomen is soft. There is no mass.     Tenderness: There is no abdominal tenderness. There is no guarding.  Musculoskeletal:     Right lower leg: No edema.     Left lower leg: No edema.  Skin:    General: Skin is warm and dry.  Neurological:     Mental Status: She is alert and oriented to person, place, and time.     Sensory: No sensory deficit.  Psychiatric:        Mood and Affect: Mood normal.        Behavior: Behavior normal.      Results for orders placed or performed in visit on 07/13/21  POCT glycosylated hemoglobin (Hb A1C)  Result Value Ref Range   Hemoglobin A1C     HbA1c POC (<> result, manual entry)     HbA1c, POC (prediabetic range)     HbA1c, POC (controlled diabetic range) 5.7 0.0 - 7.0 %       Assessment & Plan:   Merrissa was seen today for diabetes.  Diagnoses and all orders for this visit:  Type 2 diabetes mellitus with other specified complication, without long-term current use of insulin (H[facility]) -     POCT glycosylated hemoglobin (Hb A1C) -     metFORMIN (GLUCOPHAGE-XR) 500 MG 24 hr tablet; Take 1 tablet by mouth once daily with breakfast -     Semaglutide, 1 MG/DOSE, 4 MG/3ML SOPN; Inject 1 mg as directed once a week.  Class 2 severe obesity due to excess calories with serious comorbidity and body mass index (BMI) of 39.0 to 39.9 in adult [facility])  Anxiety  Dyslipidemia, goal LDL below 70  Other orders -     Discontinue: metFORMIN (GLUCOPHAGE-XR) 500 MG 24 hr tablet; Take 1 tablet by mouth once daily with breakfast -     Discontinue: Semaglutide, 1 MG/DOSE, 4 MG/3ML SOPN; Inject 1 mg as directed once a week.  A1C to goal and looks great Continue ozempic and metformin Increased  ozempic to 1mg  to help more with weight loss BP to goal Not on statin due to being in child bearing potential range  Foot exam UTD. Needs eye exam Declines covid and pneumonia vaccine Follow up in 3 months.   Anxiety is controlled off celexa.   Return in about 3 months (around 10/13/2021).    10/15/2021, PA-C

## 2021-07-14 ENCOUNTER — Other Ambulatory Visit (HOSPITAL_BASED_OUTPATIENT_CLINIC_OR_DEPARTMENT_OTHER): Payer: Self-pay

## 2021-07-21 ENCOUNTER — Other Ambulatory Visit (HOSPITAL_BASED_OUTPATIENT_CLINIC_OR_DEPARTMENT_OTHER): Payer: Self-pay

## 2021-07-27 ENCOUNTER — Ambulatory Visit (INDEPENDENT_AMBULATORY_CARE_PROVIDER_SITE_OTHER): Payer: No Typology Code available for payment source | Admitting: Physician Assistant

## 2021-07-27 ENCOUNTER — Encounter: Payer: Self-pay | Admitting: Physician Assistant

## 2021-07-27 VITALS — BP 127/66 | HR 62

## 2021-07-27 DIAGNOSIS — N898 Other specified noninflammatory disorders of vagina: Secondary | ICD-10-CM | POA: Diagnosis not present

## 2021-07-27 MED ORDER — METRONIDAZOLE 500 MG PO TABS: 500.0000 mg | ORAL_TABLET | Freq: Two times a day (BID) | ORAL | 0 refills | Status: AC

## 2021-07-27 NOTE — Patient Instructions (Signed)

## 2021-07-27 NOTE — Progress Notes (Signed)
Acute Office Visit  Subjective:     Patient ID: Courtney Bailey, female    DOB: [birth year], 36 y.o.   MRN: [id number]  Chief Complaint  Patient presents with   Vaginal Discharge    HPI Patient is in today for intermittent but worsening vaginal odor and discharge. She has had one sexual partner since January. She denies any fever, chills, abdominal pain, nausea, vomiting, dysuria. She has not tried anything to make better. She does notice that the odor is worse after her period. She does douche from time to time.   .. Active Ambulatory Problems    Diagnosis Date Noted   Class 2 severe obesity due to excess calories with serious comorbidity and body mass index (BMI) of 39.0 to 39.9 in adult (HCC) 12/17/2017   Seborrheic dermatitis of scalp 12/17/2017   Itchy scalp 12/17/2017   Eyelid dermatitis, allergic/contact, right 12/17/2017   Elevated fasting glucose 12/18/2017   Dyslipidemia, goal LDL below 70 12/18/2017   Perceived hearing changes 01/04/2018   ETD (Eustachian tube dysfunction), bilateral 01/04/2018   Depression, recurrent (HCC) 02/19/2018   Anxiety 02/19/2018   Insomnia 02/19/2018   Seasonal allergies 04/30/2018   Seasonal allergic rhinitis due to pollen 04/30/2018   Allergic conjunctivitis of both eyes 04/30/2018   Breast mass, right 11/22/2018   Frequent headaches 02/21/2019   Generalized anxiety disorder 02/27/2019   Inattention 03/05/2019   Left lumbar radiculitis 04/01/2019   Family history of blood clots 04/18/2019   Pain of left calf 04/18/2019   Abnormal glucose in pregnancy, antepartum 07/22/2019   Urinary frequency 09/19/2019   Iron deficiency anemia secondary to inadequate dietary iron intake 09/19/2019   Complicated grief 05/07/2020   Post partum depression 05/07/2020   No energy 09/27/2020   Diabetes mellitus (HCC) 09/29/2020   Dry skin 12/29/2020   Elevated liver enzymes 12/29/2020   Resolved Ambulatory Problems    Diagnosis Date Noted    Pre-diabetes 12/21/2017   Spotting between menses 05/14/2018   [redacted] weeks gestation of pregnancy 09/19/2019   Past Medical History:  Diagnosis Date   Disc degeneration, lumbar    Sleep difficulties    Weight gain      ROS  See HPI.     Objective:    BP 127/66   Pulse 62   SpO2 98%  BP Readings from Last 3 Encounters:  07/27/21 127/66  07/13/21 125/79  12/29/20 125/73      Physical Exam Constitutional:      Appearance: Normal appearance.  Cardiovascular:     Rate and Rhythm: Normal rate.  Pulmonary:     Effort: Pulmonary effort is normal.  Abdominal:     General: There is no distension.     Palpations: Abdomen is soft.     Tenderness: There is no abdominal tenderness. There is no right CVA tenderness or left CVA tenderness.  Neurological:     General: No focal deficit present.     Mental Status: She is alert and oriented to person, place, and time.  Psychiatric:        Mood and Affect: Mood normal.          Assessment & Plan:  .Marland KitchenWiktoria was seen today for vaginal discharge.  Diagnoses and all orders for this visit:  Vaginal odor -     metroNIDAZOLE (FLAGYL) 500 MG tablet; Take 1 tablet (500 mg total) by mouth 2 (two) times daily for 7 days. -     SureSwab Advanced Vaginitis Plus,TMA  Vaginal  discharge -     metroNIDAZOLE (FLAGYL) 500 MG tablet; Take 1 tablet (500 mg total) by mouth 2 (two) times daily for 7 days. -     SureSwab Advanced Vaginitis Plus,TMA   Sure swab ordered for yeast, BV, trich, GC/Chlamydia testing.  Suspect BV and treated empirically with metronidazole. Discussed NOT douching.  HO given. Discussed boric acid for prevention intermittently.  Encouraged condom use for STI prevention. Follow up as needed.    Return if symptoms worsen or fail to improve.  Tandy Gaw, PA-C

## 2021-07-28 LAB — SURESWAB® ADVANCED VAGINITIS PLUS,TMA
C. trachomatis RNA, TMA: NOT DETECTED
CANDIDA SPECIES: NOT DETECTED
Candida glabrata: NOT DETECTED
N. gonorrhoeae RNA, TMA: NOT DETECTED
SURESWAB(R) ADV BACTERIAL VAGINOSIS(BV),TMA: POSITIVE — AB
TRICHOMONAS VAGINALIS (TV),TMA: NOT DETECTED

## 2021-07-29 NOTE — Progress Notes (Signed)
Bacterial Vaginosis detected. Metronidazole will treat. No yeast.

## 2021-08-08 IMAGING — DX DG LUMBAR SPINE COMPLETE 4+V
5 series · 5 of 5 positions shown · non-contrast
Comparison: None.

CLINICAL DATA: Lower back pain radiating into the left leg

EXAM:
LUMBAR SPINE - COMPLETE 4+ VIEW

[l-spine ap]
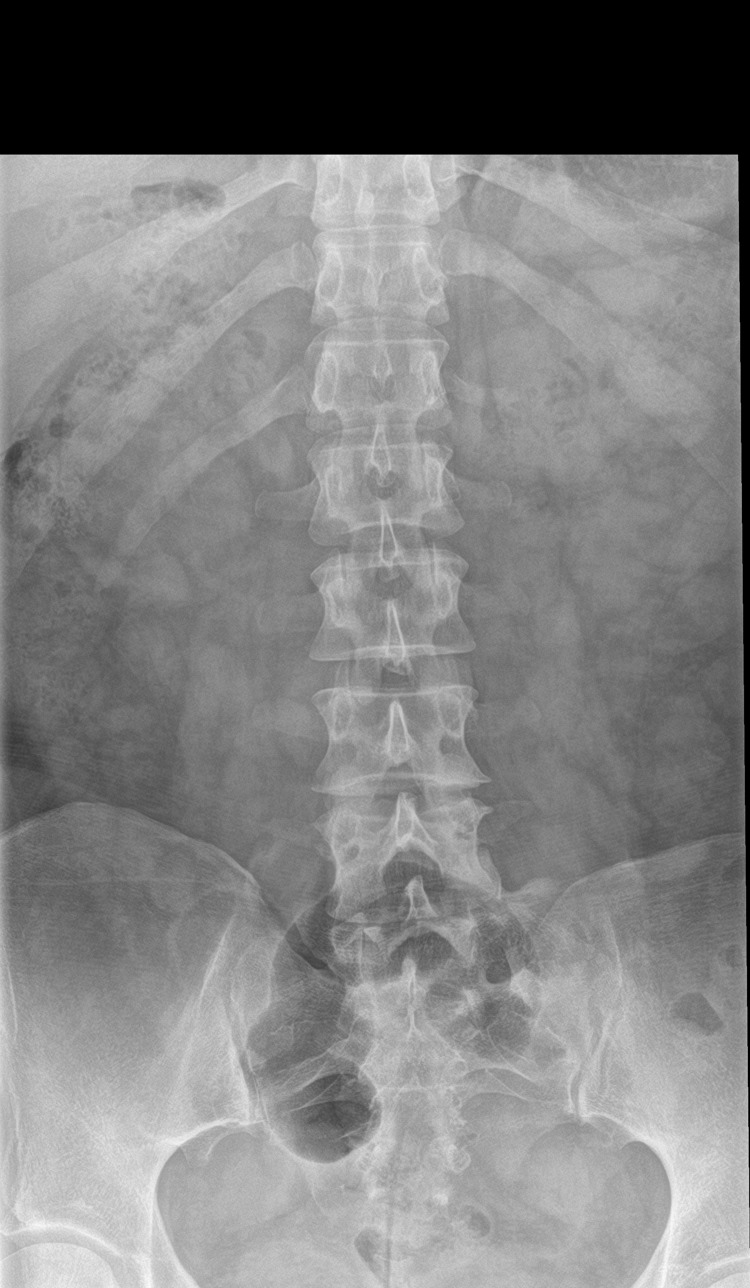

[l-spine obl (1 of 2)]
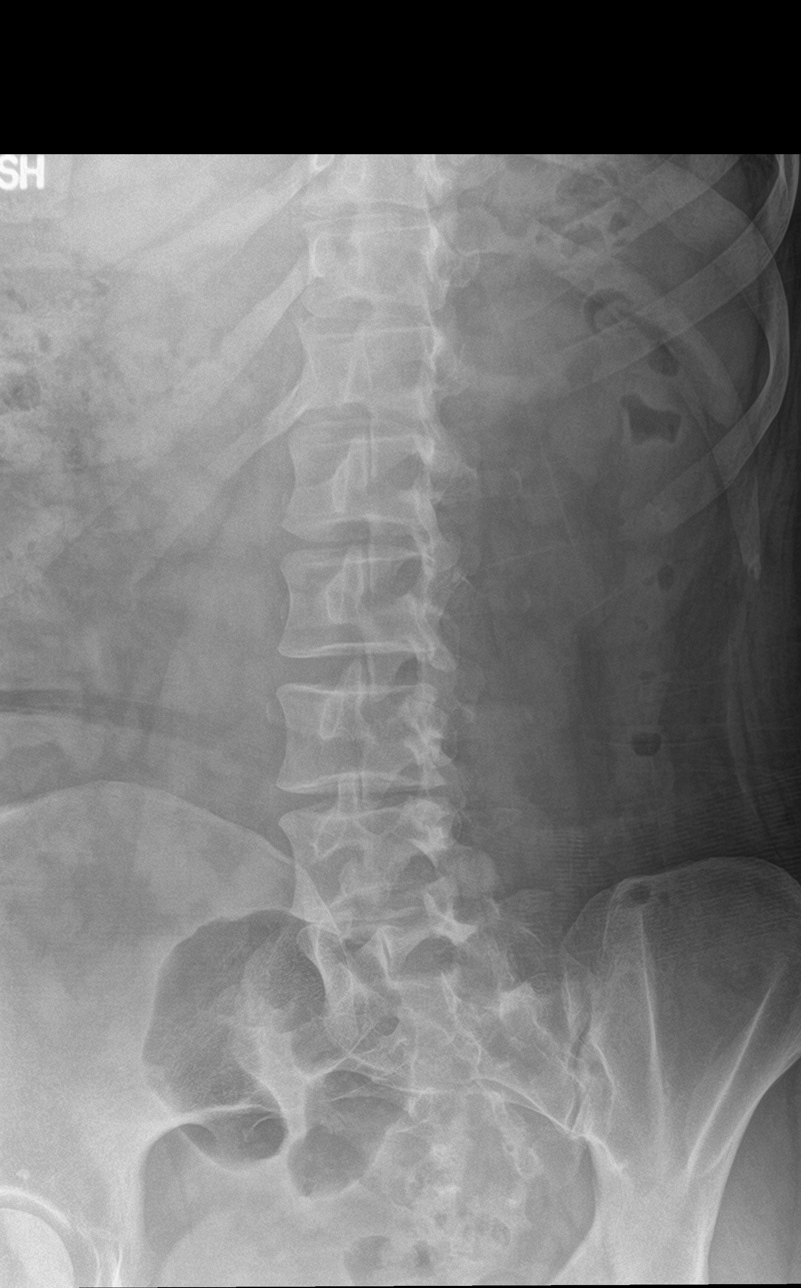

[l-spine obl (2 of 2)]
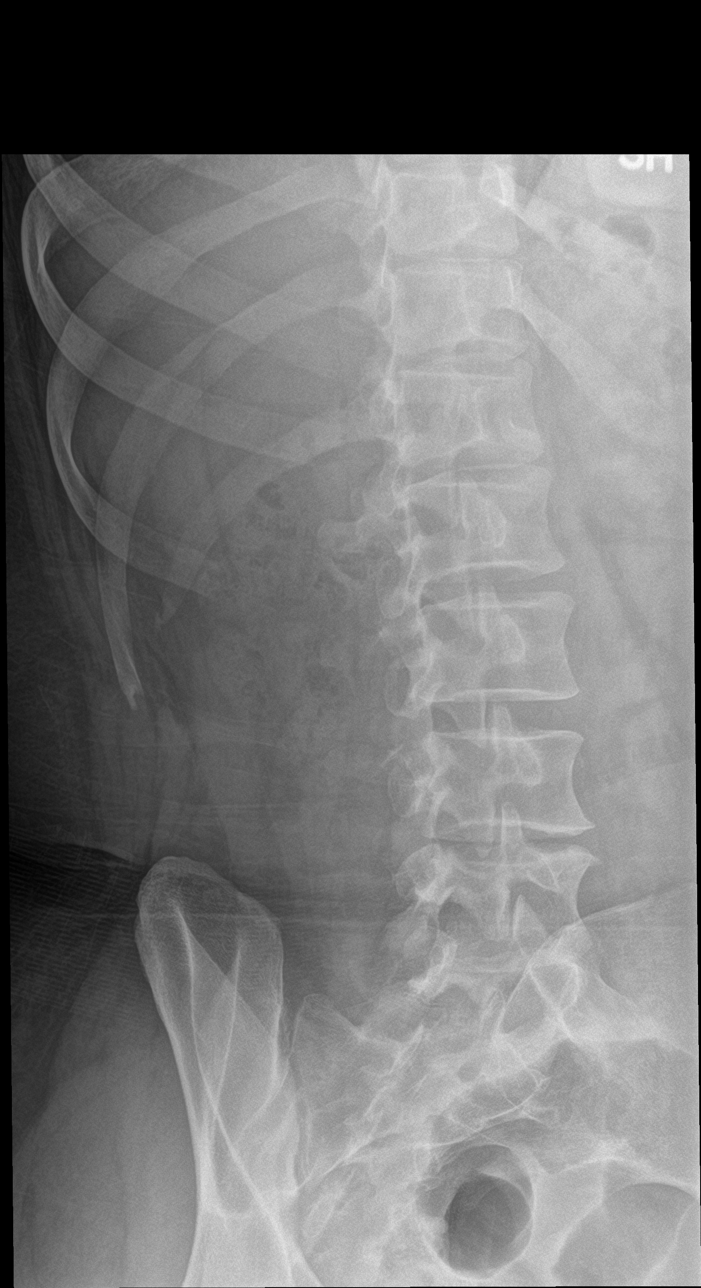

[l-spine lat]
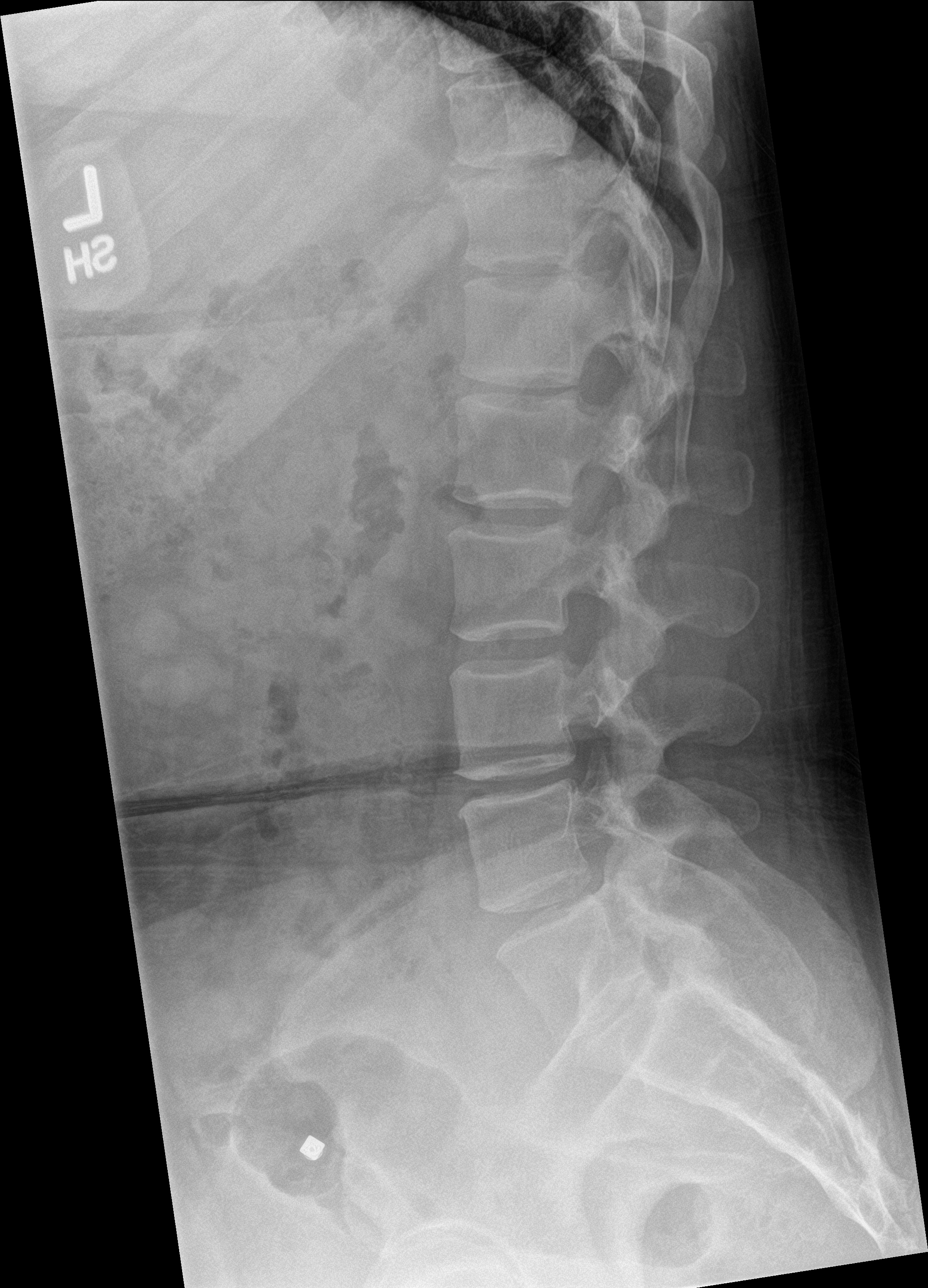

[l-spine spot]
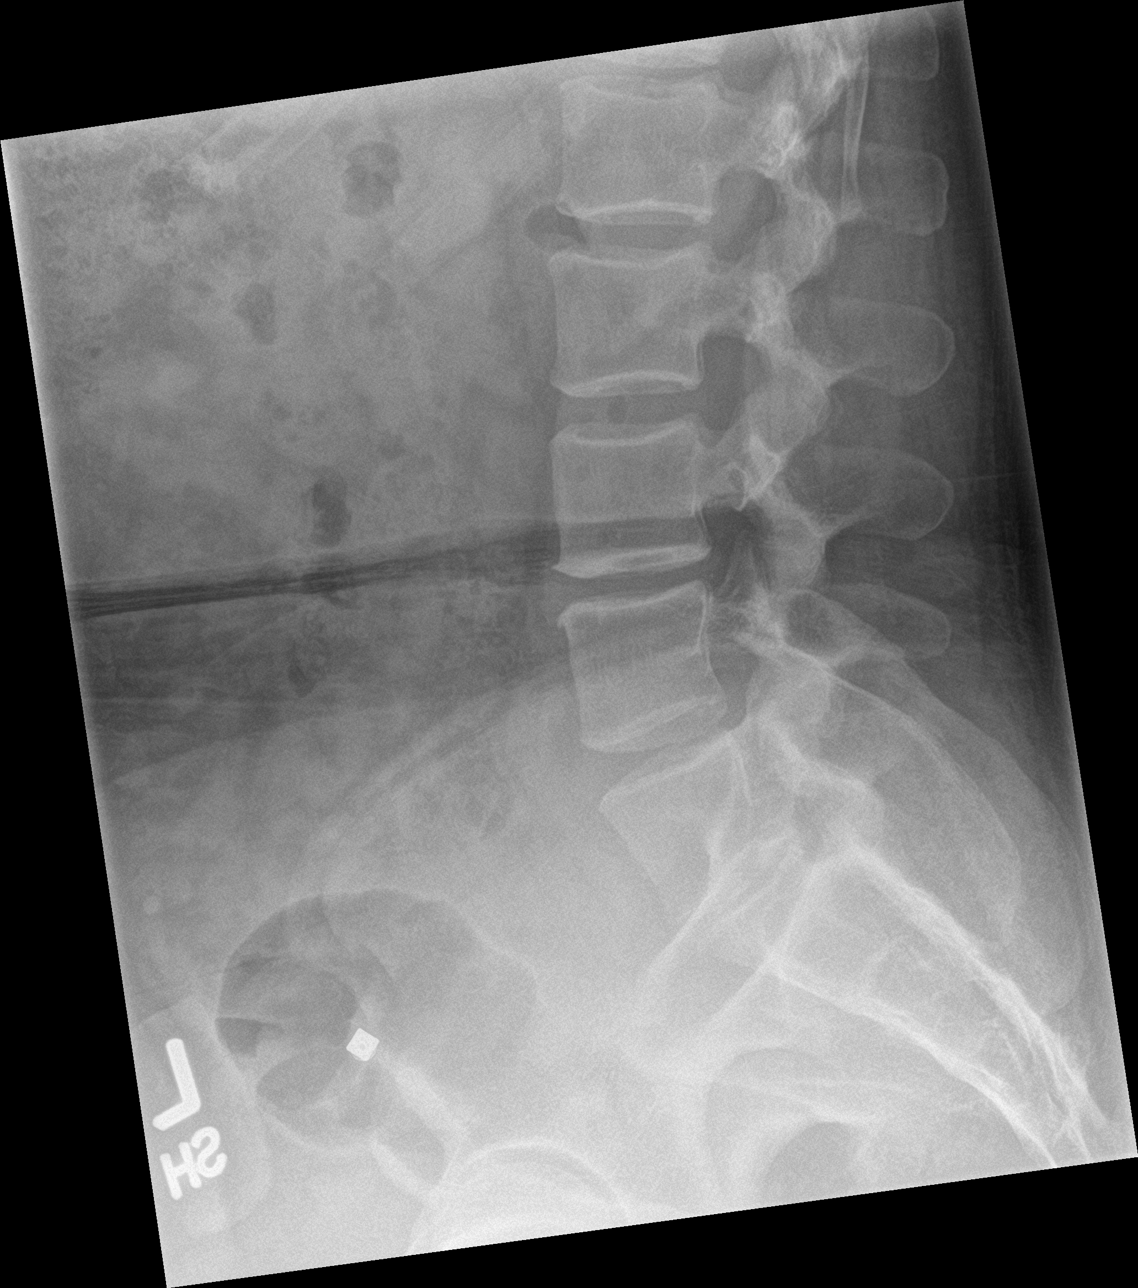

[5 of 5 positions shown; findings below may reference images not displayed]

FINDINGS: There is no evidence of lumbar spine fracture. Alignment is normal.
Mild disc height loss with subchondral sclerosis is most notable at
L3-L4 and L5-S1.
IMPRESSION: No acute fracture or malalignment. Mild lumbar spine spondylosis at
L3-L4 and L5-S1.

## 2021-09-07 IMAGING — US US EXTREM LOW VENOUS*L*
1 series · 14 of 24 positions shown · non-contrast
Comparison: None.

CLINICAL DATA: Left calf pain.

EXAM:
Left LOWER EXTREMITY VENOUS DOPPLER ULTRASOUND
TECHNIQUE: Gray-scale sonography with compression, as well as color and duplex
ultrasound, were performed to evaluate the deep venous system(s)
from the level of the common femoral vein through the popliteal and
proximal calf veins.

[Series 1: us extrem low venous*left* · 0.09mm/px · 14 of 40 slices shown]
[im 1/40]
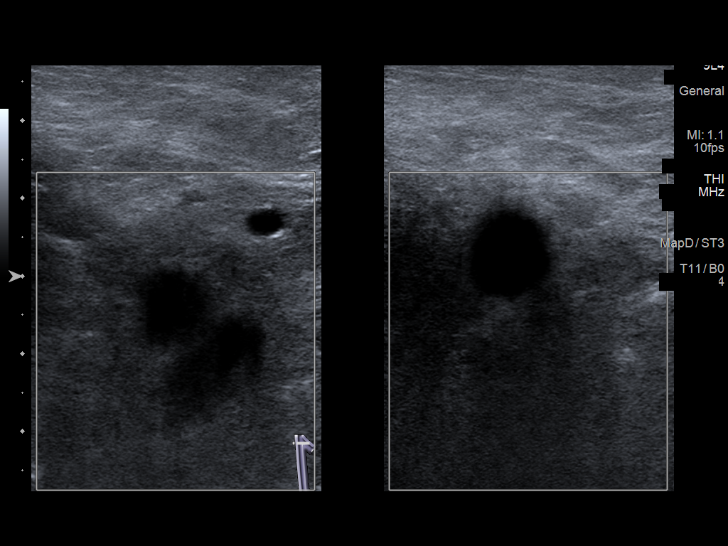
[im 4/40]
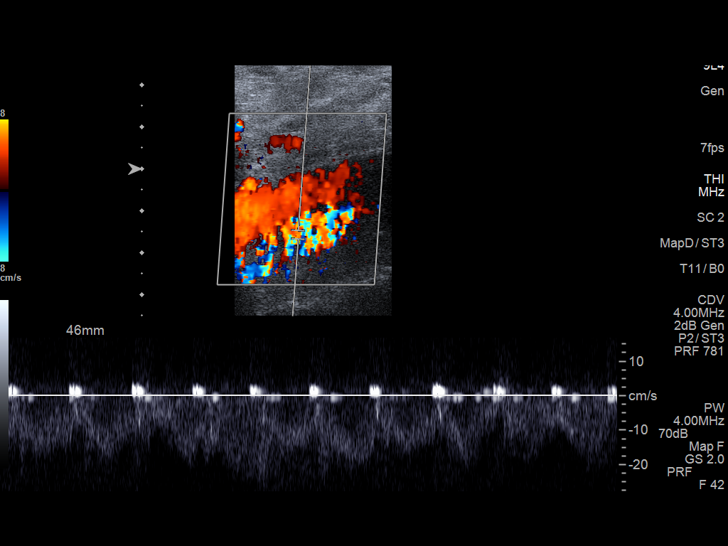
[im 7/40]
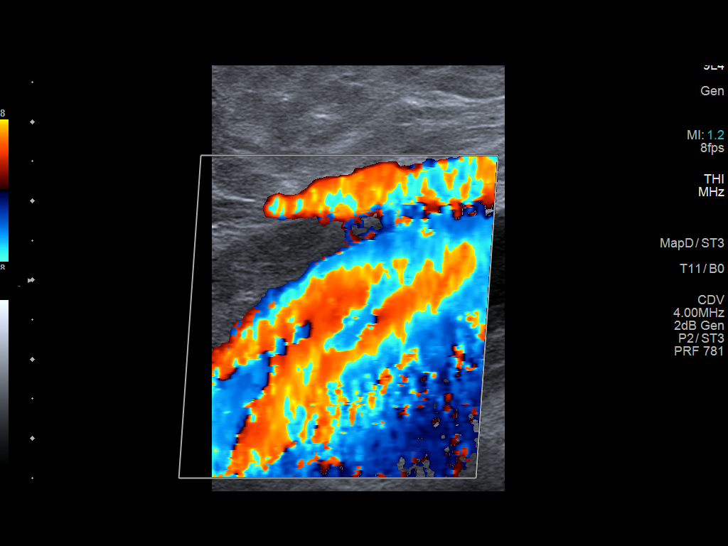
[im 11/40]
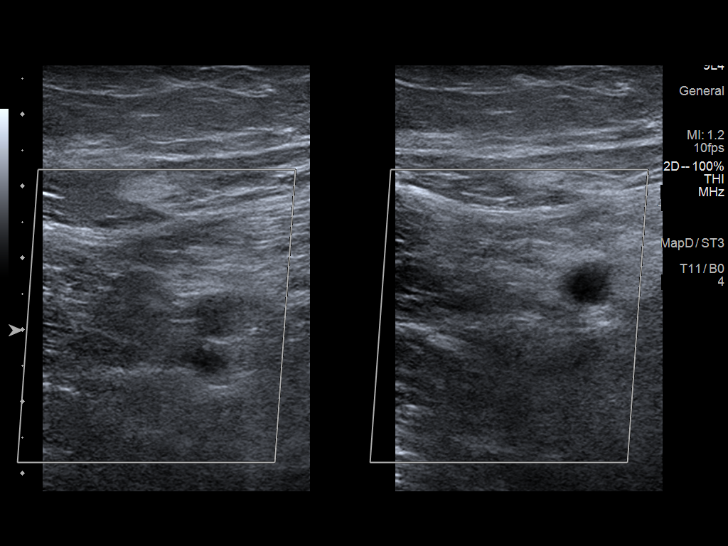
[im 12/40]
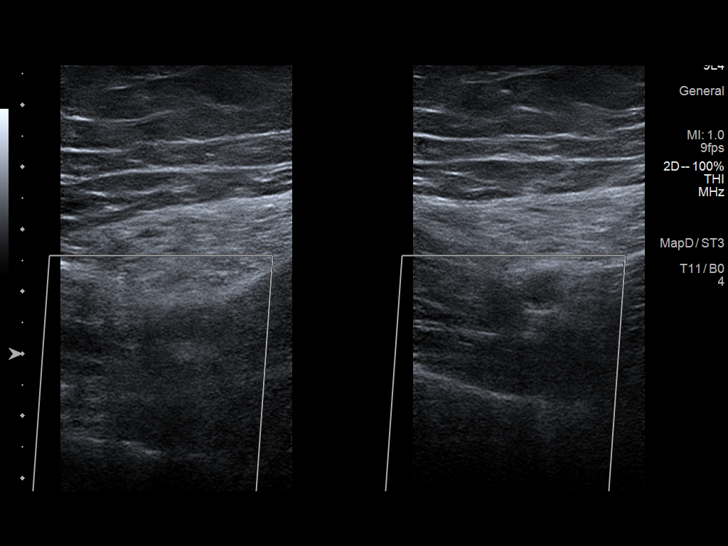
[im 16/40]
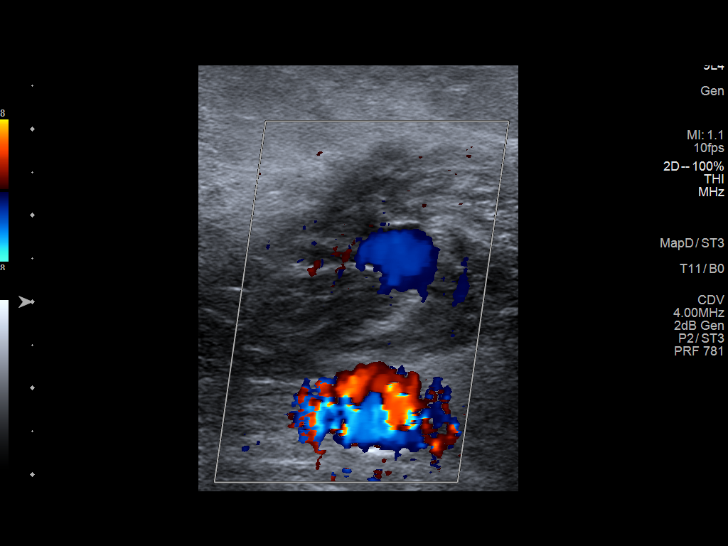
[im 19/40]
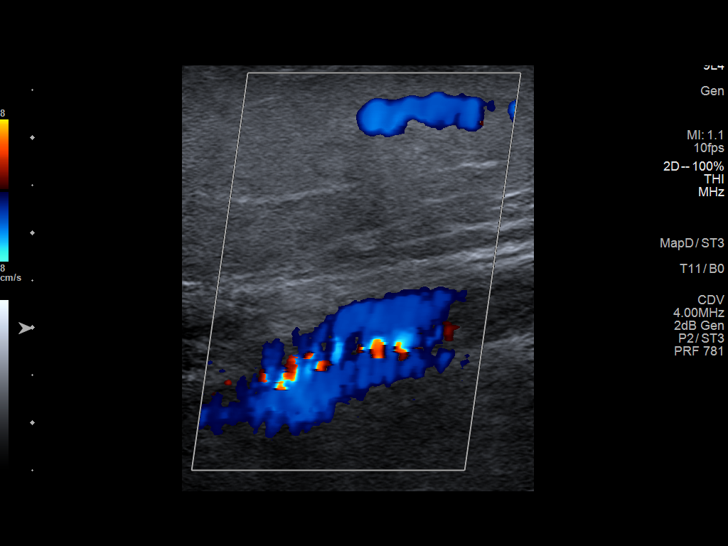
[im 21/40]
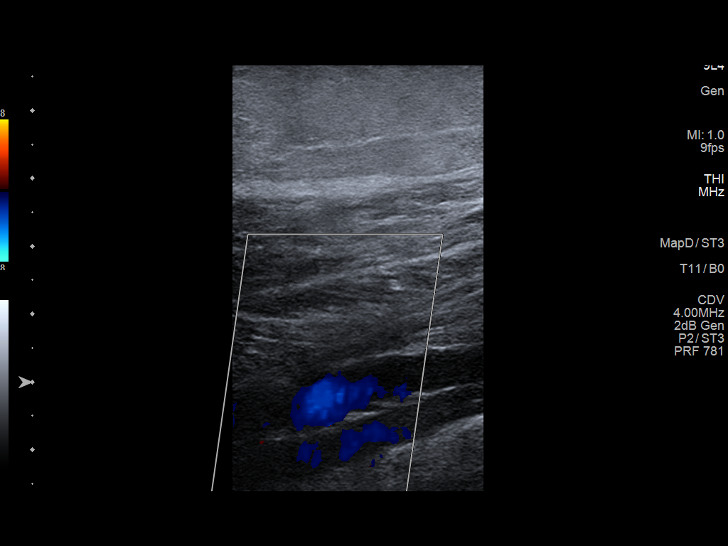
[im 24/40]
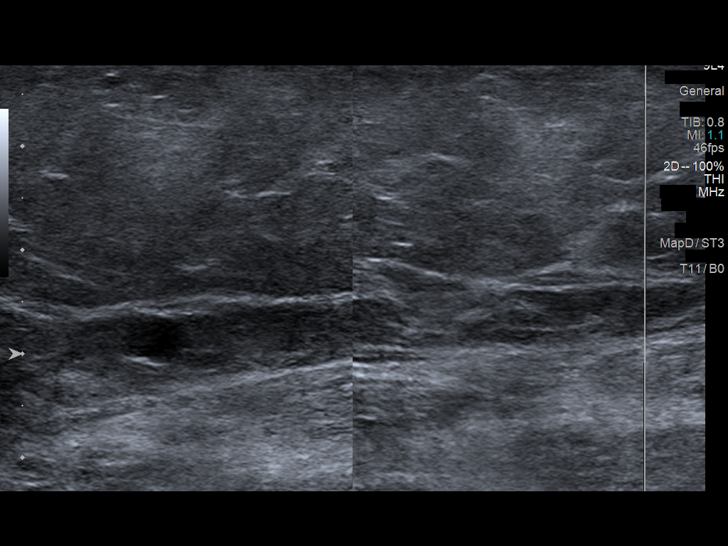
[im 28/40]
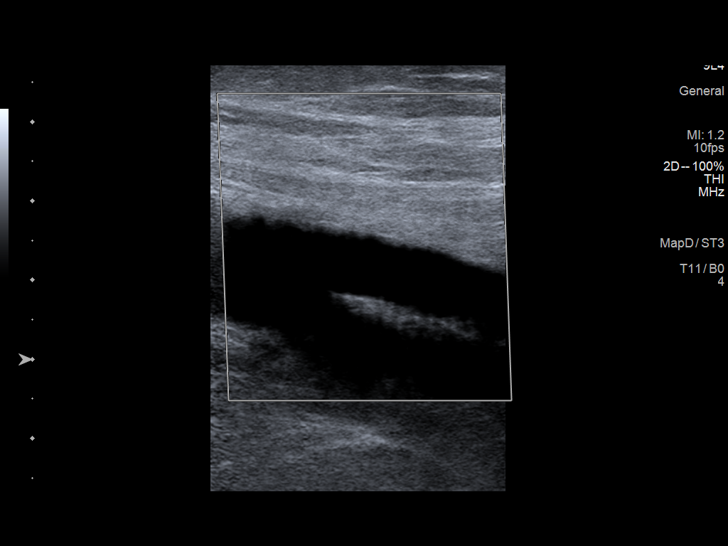
[im 31/40]
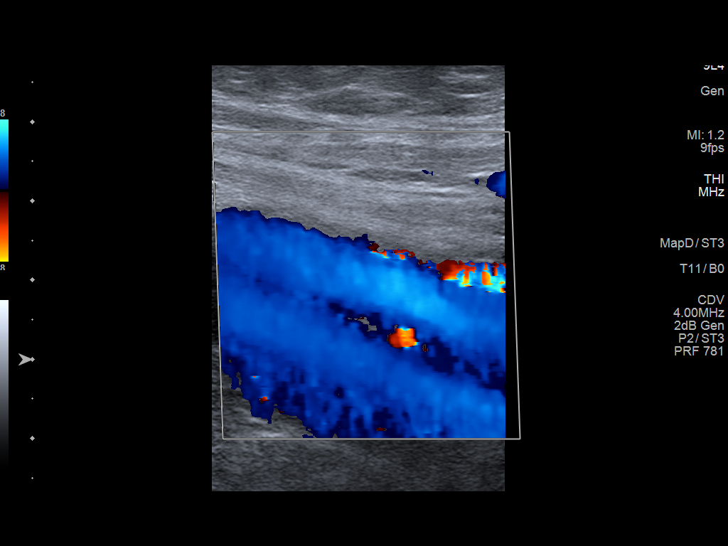
[im 33/40]
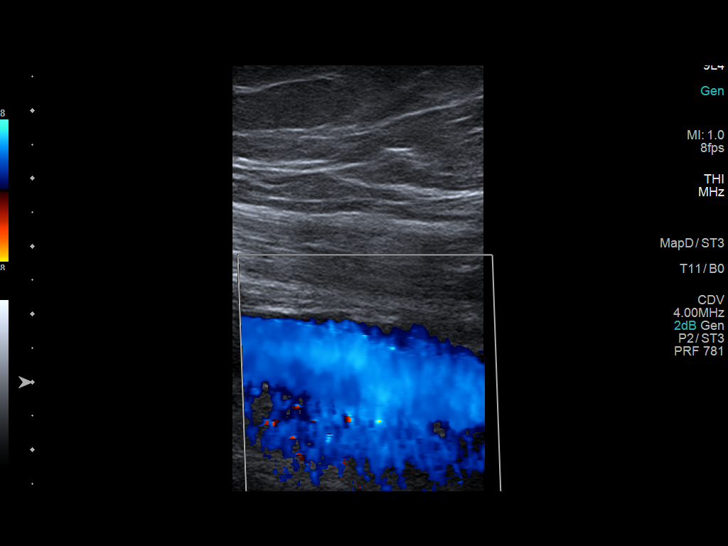
[im 36/40]
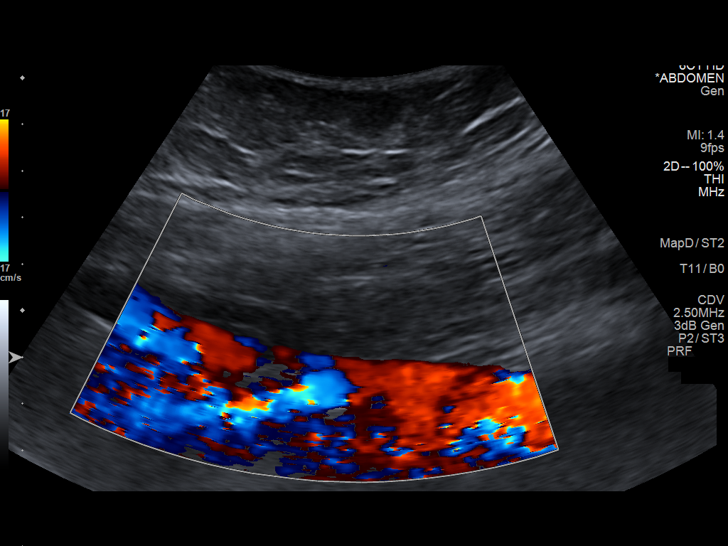
[im 40/40]
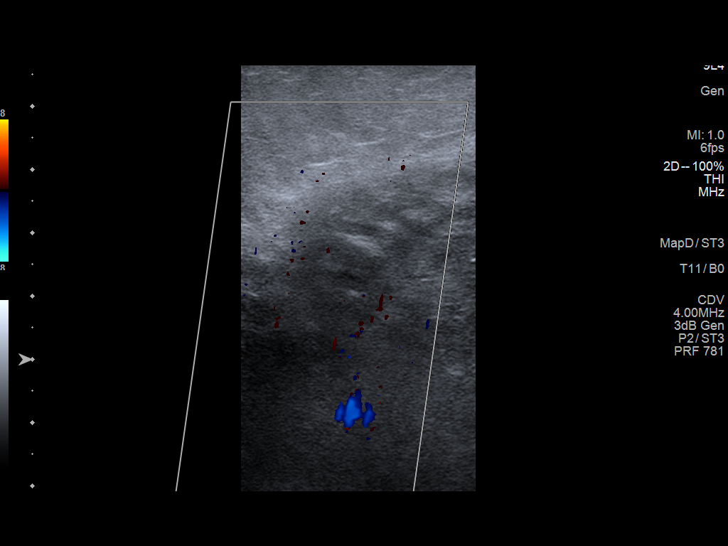

[14 of 24 positions shown; findings below may reference images not displayed]

FINDINGS: VENOUS

Normal compressibility of the common femoral, superficial femoral,
and popliteal veins, as well as the visualized calf veins.
Visualized portions of profunda femoral vein and great saphenous
vein unremarkable. No filling defects to suggest DVT on grayscale or
color Doppler imaging. Doppler waveforms show normal direction of
venous flow, normal respiratory phasicity and response to
augmentation.

Limited views of the contralateral common femoral vein are
unremarkable.

OTHER

None.

Limitations: none
IMPRESSION: No femoropopliteal DVT nor evidence of DVT within the visualized
calf veins.

If clinical symptoms are inconsistent or if there are persistent or
worsening symptoms, further imaging (possibly involving the iliac
veins) may be warranted.

## 2021-09-23 ENCOUNTER — Ambulatory Visit (INDEPENDENT_AMBULATORY_CARE_PROVIDER_SITE_OTHER): Payer: No Typology Code available for payment source | Admitting: Physician Assistant

## 2021-09-23 ENCOUNTER — Encounter: Payer: Self-pay | Admitting: Physician Assistant

## 2021-09-23 VITALS — BP 138/67 | HR 81 | Ht 66.0 in | Wt 242.0 lb

## 2021-09-23 DIAGNOSIS — Z0000 Encounter for general adult medical examination without abnormal findings: Secondary | ICD-10-CM | POA: Diagnosis not present

## 2021-09-23 NOTE — Progress Notes (Signed)
Complete physical exam  Patient: Courtney Bailey   DOB: [birth year]   36 y.o. Female  MRN: [id number]  Subjective:    Chief Complaint  Patient presents with   Annual Exam    Courtney Bailey is a 36 y.o. female who presents today for a complete physical exam. She reports consuming a general diet. The patient does not participate in regular exercise at present. She generally feels well. She reports sleeping well. She does not have additional problems to discuss today.    Most recent fall risk assessment:    09/24/2020    9:06 AM  Fall Risk   Falls in the past year? 1  Number falls in past yr: 0  Injury with Fall? 0  Risk for fall due to : History of fall(s)  Follow up Falls evaluation completed     Most recent depression screenings:    09/24/2020    9:06 AM 05/05/2020    8:35 AM  PHQ 2/9 Scores  PHQ - 2 Score 1 2  PHQ- 9 Score 4 4    Vision:Within last year and Dental: No current dental problems  Patient Active Problem List   Diagnosis Date Noted   Dry skin 12/29/2020   Elevated liver enzymes 12/29/2020   Diabetes mellitus (HCC) 09/29/2020   No energy 09/27/2020   Complicated grief 05/07/2020   Post partum depression 05/07/2020   Urinary frequency 09/19/2019   Iron deficiency anemia secondary to inadequate dietary iron intake 09/19/2019   Abnormal glucose in pregnancy, antepartum 07/22/2019   Family history of blood clots 04/18/2019   Pain of left calf 04/18/2019   Left lumbar radiculitis 04/01/2019   Inattention 03/05/2019   Generalized anxiety disorder 02/27/2019   Frequent headaches 02/21/2019   Breast mass, right 11/22/2018   Seasonal allergies 04/30/2018   Seasonal allergic rhinitis due to pollen 04/30/2018   Allergic conjunctivitis of both eyes 04/30/2018   Depression, recurrent (HCC) 02/19/2018   Anxiety 02/19/2018   Insomnia 02/19/2018   Perceived hearing changes 01/04/2018   ETD (Eustachian tube dysfunction), bilateral 01/04/2018   Elevated fasting  glucose 12/18/2017   Dyslipidemia, goal LDL below 70 12/18/2017   Class 2 severe obesity due to excess calories with serious comorbidity and body mass index (BMI) of 39.0 to 39.9 in adult (HCC) 12/17/2017   Seborrheic dermatitis of scalp 12/17/2017   Itchy scalp 12/17/2017   Eyelid dermatitis, allergic/contact, right 12/17/2017   Past Medical History:  Diagnosis Date   Disc degeneration, lumbar    Sleep difficulties    Weight gain    Family History  Problem Relation Age of Onset   Hypothyroidism Sister    Breast cancer Neg Hx    Allergies  Allergen Reactions   Penicillin G Other (See Comments)    Does not know reaction. Occurred in early childhood     Trazodone Other (See Comments)    Nightmares   Escitalopram Oxalate Anxiety    Worsening anxiety symptoms.      Patient Care Team: Nolene Ebbs as PCP - General (Family Medicine)   Outpatient Medications Prior to Visit  Medication Sig   Continuous Blood Gluc Sensor (FREESTYLE LIBRE 14 DAY SENSOR) MISC 1 application by Does not apply route every 14 (fourteen) days. Apply upper deltoid every 14 days, use reader to determine blood sugars   Insulin Pen Needle (PUBLIX PEN NEEDLE) 31G X 8 MM MISC 1 Device by Does not apply route See admin instructions. To use with Ozempic  Iron Combinations (IRON COMPLEX PO) Take 65 mg by mouth.   ketoconazole (NIZORAL) 2 % shampoo Apply 1 application topically 2 (two) times a week.   metFORMIN (GLUCOPHAGE-XR) 500 MG 24 hr tablet Take 1 tablet by mouth once daily with breakfast   Semaglutide, 1 MG/DOSE, 4 MG/3ML SOPN Inject 1 mg as directed once a week.   Prenatal Vit-DSS-Fe Cbn-FA (PRENATAL AD PO) Take by mouth. (Patient not taking: Reported on 09/23/2021)   No facility-administered medications prior to visit.    Review of Systems  All other systems reviewed and are negative.         Objective:     BP 138/67   Pulse 81   Ht 5\' 6"  (1.676 m)   Wt 242 lb (109.8 kg)   SpO2  99%   BMI 39.06 kg/m  BP Readings from Last 3 Encounters:  09/23/21 138/67  07/27/21 127/66  07/13/21 125/79   Wt Readings from Last 3 Encounters:  09/23/21 242 lb (109.8 kg)  07/13/21 249 lb (112.9 kg)  03/30/21 255 lb (115.7 kg)      Physical Exam   BP 138/67   Pulse 81   Ht 5\' 6"  (1.676 m)   Wt 242 lb (109.8 kg)   SpO2 99%   BMI 39.06 kg/m   General Appearance:    Alert, cooperative, no distress, appears stated age  Head:    Normocephalic, without obvious abnormality, atraumatic  Eyes:    PERRL, conjunctiva/corneas clear, EOM's intact, fundi    benign, both eyes  Ears:    Normal TM's and external ear canals, both ears  Nose:   Nares normal, septum midline, mucosa normal, no drainage    or sinus tenderness  Throat:   Lips, mucosa, and tongue normal; teeth and gums normal  Neck:   Supple, symmetrical, trachea midline, no adenopathy;    thyroid:  no enlargement/tenderness/nodules; no carotid   bruit or JVD  Back:     Symmetric, no curvature, ROM normal, no CVA tenderness  Lungs:     Clear to auscultation bilaterally, respirations unlabored  Chest Wall:    No tenderness or deformity   Heart:    Regular rate and rhythm, S1 and S2 normal, no murmur, rub   or gallop     Abdomen:     Soft, non-tender, bowel sounds active all four quadrants,    no masses, no organomegaly        Extremities:   Extremities normal, atraumatic, no cyanosis or edema  Pulses:   2+ and symmetric all extremities  Skin:   Skin color, texture, turgor normal, no rashes or lesions  Lymph nodes:   Cervical, supraclavicular, and axillary nodes normal  Neurologic:   CNII-XII intact, normal strength, sensation and reflexes    throughout       Assessment & Plan:    Routine Health Maintenance and Physical Exam  Immunization History  Administered Date(s) Administered   Influenza Inj Mdck Quad Pf 12/14/2019   Influenza,inj,Quad PF,6+ Mos 11/12/2020   Influenza,inj,quad, With Preservative 12/14/2019    Influenza-Unspecified 11/10/2015, 11/09/2016, 11/22/2018   Tdap 10/31/2019    Health Maintenance  Topic Date Due   COVID-19 Vaccine (1) Never done   OPHTHALMOLOGY EXAM  Never done   Diabetic kidney evaluation - Urine ACR  Never done   INFLUENZA VACCINE  08/30/2021   Hepatitis C Screening  12/29/2021 (Originally 07/07/2005)   HIV Screening  12/29/2021 (Originally 07/08/2002)   FOOT EXAM  12/29/2021   HEMOGLOBIN A1C  01/12/2022   Diabetic kidney evaluation - GFR measurement  03/31/2022   PAP SMEAR-Modifier  06/18/2022   TETANUS/TDAP  10/30/2029   HPV VACCINES  Aged Out    Discussed health benefits of physical activity, and encouraged her to engage in regular exercise appropriate for her age and condition.   .. Discussed 150 minutes of exercise a week.  Encouraged vitamin D 1000 units and Calcium 1300mg  or 4 servings of dairy a day.  Fasting labs ordered Pap UTD Flu shot will get at work Needs eye exam      , PA-C

## 2021-09-23 NOTE — Patient Instructions (Signed)

## 2021-09-29 LAB — COMPLETE METABOLIC PANEL WITH GFR
AG Ratio: 1.9 (calc) (ref 1.0–2.5)
ALT: 18 U/L (ref 6–29)
AST: 14 U/L (ref 10–30)
Albumin: 4.3 g/dL (ref 3.6–5.1)
Alkaline phosphatase (APISO): 64 U/L (ref 31–125)
BUN: 8 mg/dL (ref 7–25)
CO2: 25 mmol/L (ref 20–32)
Calcium: 9.1 mg/dL (ref 8.6–10.2)
Chloride: 105 mmol/L (ref 98–110)
Creat: 0.7 mg/dL (ref 0.50–0.97)
Globulin: 2.3 g/dL (calc) (ref 1.9–3.7)
Glucose, Bld: 82 mg/dL (ref 65–99)
Potassium: 4.3 mmol/L (ref 3.5–5.3)
Sodium: 139 mmol/L (ref 135–146)
Total Bilirubin: 0.3 mg/dL (ref 0.2–1.2)
Total Protein: 6.6 g/dL (ref 6.1–8.1)
eGFR: 116 mL/min/{1.73_m2} (ref >=60)

## 2021-09-29 LAB — CBC WITH DIFFERENTIAL/PLATELET
Absolute Monocytes: 230 cells/uL (ref 200–950)
Basophils Absolute: 31 cells/uL (ref 0–200)
Basophils Relative: 0.6 %
Eosinophils Absolute: 398 cells/uL (ref 15–500)
Eosinophils Relative: 7.8 %
HCT: 39.5 % (ref 35.0–45.0)
Hemoglobin: 13.3 g/dL (ref 11.7–15.5)
Lymphs Abs: 1969 cells/uL (ref 850–3900)
MCH: 30.6 pg (ref 27.0–33.0)
MCHC: 33.7 g/dL (ref 32.0–36.0)
MCV: 90.8 fL (ref 80.0–100.0)
MPV: 10.1 fL (ref 7.5–12.5)
Monocytes Relative: 4.5 %
Neutro Abs: 2474 cells/uL (ref 1500–7800)
Neutrophils Relative %: 48.5 %
Platelets: 319 10*3/uL (ref 140–400)
RBC: 4.35 10*6/uL (ref 3.80–5.10)
RDW: 13 % (ref 11.0–15.0)
Total Lymphocyte: 38.6 %
WBC: 5.1 10*3/uL (ref 3.8–10.8)

## 2021-09-29 LAB — LIPID PANEL W/REFLEX DIRECT LDL
Cholesterol: 182 mg/dL (ref <200)
HDL: 34 mg/dL — ABNORMAL LOW (ref >=50)
LDL Cholesterol (Calc): 123 mg/dL (calc) — ABNORMAL HIGH
Non-HDL Cholesterol (Calc): 148 mg/dL (calc) — ABNORMAL HIGH (ref <130)
Total CHOL/HDL Ratio: 5.4 (calc) — ABNORMAL HIGH (ref <5.0)
Triglycerides: 133 mg/dL (ref <150)

## 2021-09-29 LAB — TSH: TSH: 2.21 mIU/L

## 2021-09-30 ENCOUNTER — Encounter: Payer: Self-pay | Admitting: Physician Assistant

## 2021-09-30 NOTE — Progress Notes (Signed)
Courtney Bailey,   Hemoglobin and WBC look good.  Kidney, liver, glucose look great.  Thyroid normal range.  LDL improved from 6 months ago but not to goal of under 70.  I would like to start a low dose statin to help get your LDL under 70.  Are you ok with this?

## 2021-10-12 ENCOUNTER — Ambulatory Visit (INDEPENDENT_AMBULATORY_CARE_PROVIDER_SITE_OTHER): Payer: No Typology Code available for payment source | Admitting: Physician Assistant

## 2021-10-12 ENCOUNTER — Encounter: Payer: Self-pay | Admitting: Physician Assistant

## 2021-10-12 ENCOUNTER — Other Ambulatory Visit (HOSPITAL_BASED_OUTPATIENT_CLINIC_OR_DEPARTMENT_OTHER): Payer: Self-pay

## 2021-10-12 VITALS — BP 112/65 | HR 88 | Wt 243.0 lb

## 2021-10-12 DIAGNOSIS — E1169 Type 2 diabetes mellitus with other specified complication: Secondary | ICD-10-CM

## 2021-10-12 DIAGNOSIS — F411 Generalized anxiety disorder: Secondary | ICD-10-CM | POA: Diagnosis not present

## 2021-10-12 DIAGNOSIS — Z6839 Body mass index (BMI) 39.0-39.9, adult: Secondary | ICD-10-CM

## 2021-10-12 DIAGNOSIS — E785 Hyperlipidemia, unspecified: Secondary | ICD-10-CM

## 2021-10-12 DIAGNOSIS — F410 Panic disorder [episodic paroxysmal anxiety] without agoraphobia: Secondary | ICD-10-CM | POA: Diagnosis not present

## 2021-10-12 LAB — POCT GLYCOSYLATED HEMOGLOBIN (HGB A1C): Hemoglobin A1C: 5.4 % (ref 4.0–5.6)

## 2021-10-12 LAB — POCT UA - MICROALBUMIN
Albumin/Creatinine Ratio, Urine, POC: <30
Creatinine, POC: 200 mg/dL
Microalbumin Ur, POC: 30 mg/L

## 2021-10-12 MED ORDER — CLONAZEPAM 0.5 MG PO TABS
0.5000 mg | ORAL_TABLET | Freq: Every day | ORAL | 0 refills | Status: DC | PRN
  Filled 2021-10-12: qty 10, 10d supply, fill #0

## 2021-10-12 MED ORDER — ATORVASTATIN CALCIUM 10 MG PO TABS
10.0000 mg | ORAL_TABLET | Freq: Every day | ORAL | 3 refills | Status: DC
  Filled 2021-10-12: qty 90, 90d supply, fill #0
  Filled 2022-01-31: qty 90, 90d supply, fill #1
  Filled 2022-06-23: qty 90, 90d supply, fill #2

## 2021-10-12 MED ORDER — OZEMPIC (2 MG/DOSE) 8 MG/3ML ~~LOC~~ SOPN
2.0000 mg | PEN_INJECTOR | SUBCUTANEOUS | 1 refills | Status: DC
  Filled 2021-10-12: qty 9, 84d supply, fill #0

## 2021-10-12 NOTE — Progress Notes (Signed)
Established Patient Office Visit  Subjective   Patient ID: Courtney Bailey, female    DOB: November 22, 1985  Age: 36 y.o. MRN: [id number] y.o. MRN: [id number]  Chief Complaint  Patient presents with   Diabetes    HPI Pt is a 36 yo obese female with T2DM who presents to the clinic for follow up.   She is not checking her sugars. She is taking metformin and ozempic and doing great. No concerns. She has lost 35lbs in the last year. She is not exercising but plans to start at [employer] next week. No open sores or wounds. No hypoglycemic events.   Her anxiety is up some. She has had a few panic attacks that she feels like she "needs something" for. She is off celexa and wants to stay that way. Most of her anxiety is around her health and her families health.   .. Active Ambulatory Problems    Diagnosis Date Noted   Class 2 severe obesity due to excess calories with serious comorbidity and body mass index (BMI) of 39.0 to 39.9 in adult (HCC) 12/17/2017   Seborrheic dermatitis of scalp 12/17/2017   Itchy scalp 12/17/2017   Eyelid dermatitis, allergic/contact, right 12/17/2017   Elevated fasting glucose 12/18/2017   Dyslipidemia, goal LDL below 70 12/18/2017   Perceived hearing changes 01/04/2018   ETD (Eustachian tube dysfunction), bilateral 01/04/2018   Depression, recurrent (HCC) 02/19/2018   Anxiety 02/19/2018   Insomnia 02/19/2018   Seasonal allergies 04/30/2018   Seasonal allergic rhinitis due to pollen 04/30/2018   Allergic conjunctivitis of both eyes 04/30/2018   Breast mass, right 11/22/2018   Frequent headaches 02/21/2019   Generalized anxiety disorder 02/27/2019   Inattention 03/05/2019   Left lumbar radiculitis 04/01/2019   Family history of blood clots 04/18/2019   Pain of left calf 04/18/2019   Abnormal glucose in pregnancy, antepartum 07/22/2019   Urinary frequency 09/19/2019   Iron deficiency anemia secondary to inadequate dietary iron intake 09/19/2019   Complicated grief 05/07/2020    Post partum depression 05/07/2020   No energy 09/27/2020   Diabetes mellitus (HCC) 09/29/2020   Dry skin 12/29/2020   Elevated liver enzymes 12/29/2020   Panic attacks 10/12/2021   Resolved Ambulatory Problems    Diagnosis Date Noted   Pre-diabetes 12/21/2017   Spotting between menses 05/14/2018   [redacted] weeks gestation of pregnancy 09/19/2019   Past Medical History:  Diagnosis Date   Disc degeneration, lumbar    Sleep difficulties    Weight gain      ROS   See HPI.  Objective:     BP 112/65   Pulse 88   Wt 243 lb (110.2 kg)   SpO2 98%   BMI 39.22 kg/m  BP Readings from Last 3 Encounters:  10/12/21 112/65  09/23/21 138/67  07/27/21 127/66   Wt Readings from Last 3 Encounters:  10/12/21 243 lb (110.2 kg)  09/23/21 242 lb (109.8 kg)  07/13/21 249 lb (112.9 kg)      Physical Exam Constitutional:      Appearance: Normal appearance. She is obese.  HENT:     Head: Normocephalic.  Cardiovascular:     Rate and Rhythm: Normal rate and regular rhythm.     Pulses: Normal pulses.     Heart sounds: Normal heart sounds.  Musculoskeletal:     Right lower leg: No edema.     Left lower leg: No edema.  Neurological:     General: No focal deficit present.  Mental Status: She is alert.  Psychiatric:        Mood and Affect: Mood normal.      Results for orders placed or performed in visit on 10/12/21  POCT glycosylated hemoglobin (Hb A1C)  Result Value Ref Range   Hemoglobin A1C 5.4 4.0 - 5.6 %   HbA1c POC (<> result, manual entry)     HbA1c, POC (prediabetic range)     HbA1c, POC (controlled diabetic range)    POCT UA - Microalbumin  Result Value Ref Range   Microalbumin Ur, POC 30 mg/L   Creatinine, POC 200 mg/dL   Albumin/Creatinine Ratio, Urine, POC <30        Assessment & Plan:  .Marland KitchenShakthi was seen today for diabetes.  Diagnoses and all orders for this visit:  Type 2 diabetes mellitus with other specified complication, without long-term current  use of insulin (HCC) -     POCT glycosylated hemoglobin (Hb A1C) -     POCT UA - Microalbumin -     Semaglutide, 2 MG/DOSE, (OZEMPIC, 2 MG/DOSE,) 8 MG/3ML SOPN; Inject 2 mg into the skin once a week.  Generalized anxiety disorder  Panic attacks -     clonazePAM (KLONOPIN) 0.5 MG tablet; Take 1 tablet (0.5 mg total) by mouth daily as needed for anxiety.  Dyslipidemia, goal LDL below 70 -     atorvastatin (LIPITOR) 10 MG tablet; Take 1 tablet (10 mg total) by mouth daily.  Class 2 severe obesity due to excess calories with serious comorbidity and body mass index (BMI) of 39.0 to 39.9 in adult (HCC)   A1C to goal Continue on same medications Increased ozempic for weight loss to 2mg  weekly Down 35lbs from start BP to goal Normal microalbumin Discussed statin, agreed to start lipitor Discussed eye exam Foot exam UTD Declined covid/flu shot Follow up in 3 months  Discussed anxiety She is off celexa but having some break through panic attacks Klonapin given as needed Discussed regular exercise and coping skills She may need to get back on daily medications if panic attacks worsen    Return in about 3 months (around 01/11/2022).    01/13/2022, PA-C

## 2021-11-04 ENCOUNTER — Telehealth (INDEPENDENT_AMBULATORY_CARE_PROVIDER_SITE_OTHER): Payer: No Typology Code available for payment source | Admitting: Family Medicine

## 2021-11-04 ENCOUNTER — Encounter: Payer: Self-pay | Admitting: Physician Assistant

## 2021-11-04 DIAGNOSIS — R197 Diarrhea, unspecified: Secondary | ICD-10-CM | POA: Diagnosis not present

## 2021-11-04 DIAGNOSIS — R112 Nausea with vomiting, unspecified: Secondary | ICD-10-CM | POA: Diagnosis not present

## 2021-11-04 DIAGNOSIS — R1084 Generalized abdominal pain: Secondary | ICD-10-CM | POA: Diagnosis not present

## 2021-11-04 NOTE — Progress Notes (Signed)
    Virtual Visit via Video Note  I connected with Courtney Bailey on 11/04/21 at 11:10 AM EDT by a video enabled telemedicine application and verified that I am speaking with the correct person using two identifiers.   I discussed the limitations of evaluation and management by telemedicine and the availability of in person appointments. The patient expressed understanding and agreed to proceed.  Patient location: at home Provider location: in office  Subjective:    CC:  No chief complaint on file.   HPI:  Last night after had [place] sandwich and a protein drink and then 20 min later had stomach pain, vomiting and diarrhea.  Throwing up yellow bile. Had cracker and ginger ale. Putting pressure on the area helps.  She has been exercising. Abd pain with mid abdomen. Had similar pain once before No fever.   Ozempic 2mg  injection on Wednesday.    Past medical history, Surgical history, Family history not pertinant except as noted below, Social history, Allergies, and medications have been entered into the medical record, reviewed, and corrections made.    Objective:    General: Speaking clearly in complete sentences without any shortness of breath.  Alert and oriented x3.  Normal judgment. No apparent acute distress.    Impression and Recommendations:    Problem List Items Addressed This Visit   None Visit Diagnoses     Nausea vomiting and diarrhea    -  Primary   Relevant Orders   CBC with Differential/Platelet   COMPLETE METABOLIC PANEL WITH GFR   Lipase   Generalized abdominal pain       Relevant Orders   CBC with Differential/Platelet   COMPLETE METABOLIC PANEL WITH GFR   Lipase       Unusual to have more sig symptoms after being on the 2 mg dose of ozempic for  month.  I think we should check for pancreatitis and hepatitis. Consider GB evaluation as well. She has some nausea med at home but I can refill if needed.  She is feeling a little better today. Work on  hydration esp with the diarrhea.    Orders Placed This Encounter  Procedures   CBC with Differential/Platelet   COMPLETE METABOLIC PANEL WITH GFR   Lipase    No orders of the defined types were placed in this encounter.    I discussed the assessment and treatment plan with the patient. The patient was provided an opportunity to ask questions and all were answered. The patient agreed with the plan and demonstrated an understanding of the instructions.   The patient was advised to call back or seek an in-person evaluation if the symptoms worsen or if the condition fails to improve as anticipated.   Beatrice Lecher, MD

## 2021-11-07 ENCOUNTER — Other Ambulatory Visit (HOSPITAL_BASED_OUTPATIENT_CLINIC_OR_DEPARTMENT_OTHER): Payer: Self-pay

## 2021-11-07 ENCOUNTER — Encounter: Payer: Self-pay | Admitting: Physician Assistant

## 2021-11-08 NOTE — Telephone Encounter (Signed)
Will wait for Courtney Bailey to address. I don't see any medical conditions that would warrant no jury duty.

## 2021-11-16 ENCOUNTER — Other Ambulatory Visit (HOSPITAL_BASED_OUTPATIENT_CLINIC_OR_DEPARTMENT_OTHER): Payer: Self-pay

## 2021-11-16 ENCOUNTER — Ambulatory Visit (INDEPENDENT_AMBULATORY_CARE_PROVIDER_SITE_OTHER): Payer: No Typology Code available for payment source | Admitting: Physician Assistant

## 2021-11-16 VITALS — BP 122/72 | HR 76 | Ht 66.0 in | Wt 237.0 lb

## 2021-11-16 DIAGNOSIS — R1013 Epigastric pain: Secondary | ICD-10-CM

## 2021-11-16 DIAGNOSIS — T887XXA Unspecified adverse effect of drug or medicament, initial encounter: Secondary | ICD-10-CM

## 2021-11-16 DIAGNOSIS — F411 Generalized anxiety disorder: Secondary | ICD-10-CM | POA: Diagnosis not present

## 2021-11-16 DIAGNOSIS — R112 Nausea with vomiting, unspecified: Secondary | ICD-10-CM

## 2021-11-16 MED ORDER — ONDANSETRON 8 MG PO TBDP
8.0000 mg | ORAL_TABLET | Freq: Three times a day (TID) | ORAL | 1 refills | Status: DC | PRN
  Filled 2021-11-16: qty 20, 7d supply, fill #0

## 2021-11-16 NOTE — Progress Notes (Signed)
   Established Patient Office Visit  Subjective   Patient ID: Courtney Bailey, female    DOB: 1985/03/16  Age: 36 y.o. MRN: [id number]  Chief Complaint  Patient presents with   Follow-up    Med problems     HPI Pt is a 36 yo female who is here to follow up on epigastric pain and nausea associated with her ozempic 2mg  weekly shot. She saw Dr. Madilyn Fireman last week and did not get labs that were ordered. She is due for her 7 day shot today. She had been on the shots for a few weeks before what happened last week with severe epigastric pain, nausea, vomiting, diarrhea. No fever, chills. She feels better now.  She is requesting a jury duty note due to her anxiety about her diarrhea due to shots.   ROS See HPI.    Objective:     BP 122/72   Pulse 76   Ht 5\' 6"  (1.676 m)   Wt 237 lb (107.5 kg)   SpO2 97%   BMI 38.25 kg/m  BP Readings from Last 3 Encounters:  11/16/21 122/72  10/12/21 112/65  09/23/21 138/67   Wt Readings from Last 3 Encounters:  11/16/21 237 lb (107.5 kg)  10/12/21 243 lb (110.2 kg)  09/23/21 242 lb (109.8 kg)      Physical Exam Constitutional:      Appearance: Normal appearance.  HENT:     Head: Normocephalic.  Cardiovascular:     Rate and Rhythm: Normal rate and regular rhythm.  Pulmonary:     Effort: Pulmonary effort is normal.     Breath sounds: Normal breath sounds.  Abdominal:     General: Bowel sounds are normal. There is no distension.     Palpations: Abdomen is soft. There is no mass.     Tenderness: There is no abdominal tenderness. There is no right CVA tenderness, left CVA tenderness, guarding or rebound.     Hernia: No hernia is present.  Neurological:     General: No focal deficit present.     Mental Status: She is alert and oriented to person, place, and time.  Psychiatric:        Mood and Affect: Mood normal.     See HPI.     Assessment & Plan:  .Marland KitchenJennier was seen today for follow-up.  Diagnoses and all orders for this  visit:  Epigastric pain -     Lipase -     COMPLETE METABOLIC PANEL WITH GFR -     CBC w/Diff/Platelet -     US Abdomen Complete; Future  Nausea and vomiting, unspecified vomiting type -     ondansetron (ZOFRAN-ODT) 8 MG disintegrating tablet; Take 1 tablet (8 mg total) by mouth every 8 (eight) hours as needed for nausea. -     US Abdomen Complete; Future  Medication side effects  Generalized anxiety disorder   Get labs today.  Zofran for nausea. Get u/s to look for gallstones Continue with ozempic injections at 2mg  but will consider decreasing to 1mg  if needed to for side effects. Jury duty note given.    Iran Planas, PA-C

## 2021-11-17 LAB — CBC WITH DIFFERENTIAL/PLATELET
Absolute Monocytes: 440 cells/uL (ref 200–950)
Basophils Absolute: 50 cells/uL (ref 0–200)
Basophils Relative: 0.7 %
Eosinophils Absolute: 320 cells/uL (ref 15–500)
Eosinophils Relative: 4.5 %
HCT: 40.5 % (ref 35.0–45.0)
Hemoglobin: 13.6 g/dL (ref 11.7–15.5)
Lymphs Abs: 2513 cells/uL (ref 850–3900)
MCH: 30.1 pg (ref 27.0–33.0)
MCHC: 33.6 g/dL (ref 32.0–36.0)
MCV: 89.6 fL (ref 80.0–100.0)
MPV: 10.4 fL (ref 7.5–12.5)
Monocytes Relative: 6.2 %
Neutro Abs: 3777 cells/uL (ref 1500–7800)
Neutrophils Relative %: 53.2 %
Platelets: 322 10*3/uL (ref 140–400)
RBC: 4.52 10*6/uL (ref 3.80–5.10)
RDW: 12.9 % (ref 11.0–15.0)
Total Lymphocyte: 35.4 %
WBC: 7.1 10*3/uL (ref 3.8–10.8)

## 2021-11-17 LAB — COMPLETE METABOLIC PANEL WITH GFR
AG Ratio: 1.5 (calc) (ref 1.0–2.5)
ALT: 16 U/L (ref 6–29)
AST: 16 U/L (ref 10–30)
Albumin: 4.2 g/dL (ref 3.6–5.1)
Alkaline phosphatase (APISO): 72 U/L (ref 31–125)
BUN: 12 mg/dL (ref 7–25)
CO2: 24 mmol/L (ref 20–32)
Calcium: 9.4 mg/dL (ref 8.6–10.2)
Chloride: 105 mmol/L (ref 98–110)
Creat: 0.85 mg/dL (ref 0.50–0.97)
Globulin: 2.8 g/dL (calc) (ref 1.9–3.7)
Glucose, Bld: 76 mg/dL (ref 65–99)
Potassium: 4.3 mmol/L (ref 3.5–5.3)
Sodium: 139 mmol/L (ref 135–146)
Total Bilirubin: 0.4 mg/dL (ref 0.2–1.2)
Total Protein: 7 g/dL (ref 6.1–8.1)
eGFR: 92 mL/min/{1.73_m2} (ref >=60)

## 2021-11-17 LAB — LIPASE: Lipase: 48 U/L (ref 7–60)

## 2021-11-18 ENCOUNTER — Encounter: Payer: Self-pay | Admitting: Physician Assistant

## 2021-11-18 NOTE — Progress Notes (Signed)
Lipase your pancreatic enzyme is normal.  Kidney, liver, glucose look great.  Normal hemoglobin and WBC.  Labs look great.

## 2021-11-25 ENCOUNTER — Other Ambulatory Visit (HOSPITAL_BASED_OUTPATIENT_CLINIC_OR_DEPARTMENT_OTHER): Payer: Self-pay

## 2021-11-29 ENCOUNTER — Other Ambulatory Visit (HOSPITAL_BASED_OUTPATIENT_CLINIC_OR_DEPARTMENT_OTHER): Payer: Self-pay

## 2021-11-29 ENCOUNTER — Encounter: Payer: Self-pay | Admitting: Physician Assistant

## 2021-11-29 MED ORDER — OZEMPIC (1 MG/DOSE) 4 MG/3ML ~~LOC~~ SOPN
1.0000 mg | PEN_INJECTOR | SUBCUTANEOUS | 0 refills | Status: DC
  Filled 2021-11-29 – 2021-12-05 (×2): qty 3, 28d supply, fill #0
  Filled 2022-01-02: qty 3, 28d supply, fill #1

## 2021-12-05 ENCOUNTER — Other Ambulatory Visit (HOSPITAL_BASED_OUTPATIENT_CLINIC_OR_DEPARTMENT_OTHER): Payer: Self-pay

## 2021-12-07 ENCOUNTER — Ambulatory Visit (INDEPENDENT_AMBULATORY_CARE_PROVIDER_SITE_OTHER): Payer: No Typology Code available for payment source

## 2021-12-07 DIAGNOSIS — R1013 Epigastric pain: Secondary | ICD-10-CM | POA: Diagnosis not present

## 2021-12-07 DIAGNOSIS — R112 Nausea with vomiting, unspecified: Secondary | ICD-10-CM

## 2021-12-09 ENCOUNTER — Encounter: Payer: Self-pay | Admitting: Physician Assistant

## 2021-12-09 ENCOUNTER — Other Ambulatory Visit: Payer: Self-pay | Admitting: Physician Assistant

## 2021-12-09 DIAGNOSIS — N2889 Other specified disorders of kidney and ureter: Secondary | ICD-10-CM

## 2021-12-09 NOTE — Telephone Encounter (Signed)
See note

## 2021-12-09 NOTE — Progress Notes (Signed)
No gallstones.  You do have fatty liver. Weight loss can help with this. Healthy diet and exercise. Avoiding alcohol.  You do have a 3.4cm lesion in the left kidney. It is likely a cyst but we need to order MRI without and with contrast to take a better look.

## 2021-12-13 NOTE — Telephone Encounter (Signed)
Task was completed on 12/12/21. Patient has been notified that there is a copay/charge for the study. Per imaging department - patient has been scheduled.

## 2021-12-14 ENCOUNTER — Encounter: Payer: Self-pay | Admitting: Physician Assistant

## 2021-12-14 ENCOUNTER — Ambulatory Visit (INDEPENDENT_AMBULATORY_CARE_PROVIDER_SITE_OTHER): Payer: No Typology Code available for payment source | Admitting: Physician Assistant

## 2021-12-14 DIAGNOSIS — J329 Chronic sinusitis, unspecified: Secondary | ICD-10-CM

## 2021-12-14 DIAGNOSIS — J40 Bronchitis, not specified as acute or chronic: Secondary | ICD-10-CM | POA: Diagnosis not present

## 2021-12-14 DIAGNOSIS — J029 Acute pharyngitis, unspecified: Secondary | ICD-10-CM | POA: Diagnosis not present

## 2021-12-14 LAB — POCT RAPID STREP A (OFFICE): Rapid Strep A Screen: NEGATIVE

## 2021-12-14 MED ORDER — AZITHROMYCIN 250 MG PO TABS: ORAL_TABLET | ORAL | 0 refills | Status: DC

## 2021-12-14 NOTE — Progress Notes (Signed)
Acute Office Visit  Subjective:     Patient ID: Courtney Bailey, female    DOB: 06-Feb-1985, 36 y.o.   MRN: [id number]  Chief Complaint  Patient presents with   Sore Throat    HPI Patient is in today for cough, congestion, sore throat and harder to swallow for the last 5 days. She is taking OTC mucinex and cold and cough preparations. She feels some better but continues to have most of her symptoms. 2 negative home covid test. Her daughter and mother have also been sick with similar symptoms. No fever, chills, body aches, SOB.   Marland Kitchen. Active Ambulatory Problems    Diagnosis Date Noted   Class 2 severe obesity due to excess calories with serious comorbidity and body mass index (BMI) of 39.0 to 39.9 in adult (HCC) 12/17/2017   Seborrheic dermatitis of scalp 12/17/2017   Itchy scalp 12/17/2017   Eyelid dermatitis, allergic/contact, right 12/17/2017   Elevated fasting glucose 12/18/2017   Dyslipidemia, goal LDL below 70 12/18/2017   Perceived hearing changes 01/04/2018   ETD (Eustachian tube dysfunction), bilateral 01/04/2018   Depression, recurrent (HCC) 02/19/2018   Anxiety 02/19/2018   Insomnia 02/19/2018   Seasonal allergies 04/30/2018   Seasonal allergic rhinitis due to pollen 04/30/2018   Allergic conjunctivitis of both eyes 04/30/2018   Breast mass, right 11/22/2018   Frequent headaches 02/21/2019   Generalized anxiety disorder 02/27/2019   Inattention 03/05/2019   Left lumbar radiculitis 04/01/2019   Family history of blood clots 04/18/2019   Pain of left calf 04/18/2019   Abnormal glucose in pregnancy, antepartum 07/22/2019   Urinary frequency 09/19/2019   Iron deficiency anemia secondary to inadequate dietary iron intake 09/19/2019   Complicated grief 05/07/2020   Post partum depression 05/07/2020   No energy 09/27/2020   Diabetes mellitus (HCC) 09/29/2020   Dry skin 12/29/2020   Elevated liver enzymes 12/29/2020   Panic attacks 10/12/2021   Resolved Ambulatory  Problems    Diagnosis Date Noted   Pre-diabetes 12/21/2017   Spotting between menses 05/14/2018   [redacted] weeks gestation of pregnancy 09/19/2019   Past Medical History:  Diagnosis Date   Disc degeneration, lumbar    Sleep difficulties    Weight gain      ROS  See HPI.     Objective:    There were no vitals taken for this visit. BP Readings from Last 3 Encounters:  11/16/21 122/72  10/12/21 112/65  09/23/21 138/67   Wt Readings from Last 3 Encounters:  11/16/21 237 lb (107.5 kg)  10/12/21 243 lb (110.2 kg)  09/23/21 242 lb (109.8 kg)      Physical Exam Constitutional:      Appearance: She is well-developed. She is obese.  HENT:     Head: Normocephalic.     Right Ear: Tympanic membrane and ear canal normal.     Left Ear: Tympanic membrane and ear canal normal.     Mouth/Throat:     Mouth: Mucous membranes are moist.     Pharynx: Uvula midline. Posterior oropharyngeal erythema present. No oropharyngeal exudate.     Tonsils: No tonsillar exudate or tonsillar abscesses. 0 on the right. 0 on the left.  Eyes:     Conjunctiva/sclera: Conjunctivae normal.  Cardiovascular:     Rate and Rhythm: Normal rate and regular rhythm.  Pulmonary:     Effort: Pulmonary effort is normal.     Breath sounds: Normal breath sounds.  Musculoskeletal:     Cervical back: Normal  range of motion.  Lymphadenopathy:     Cervical: Cervical adenopathy present.  Neurological:     General: No focal deficit present.     Mental Status: She is alert.  Psychiatric:        Mood and Affect: Mood normal.        Results for orders placed or performed in visit on 12/14/21  POCT rapid strep A  Result Value Ref Range   Rapid Strep A Screen Negative Negative        Assessment & Plan:  .Marland KitchenDonnise was seen today for sore throat.  Diagnoses and all orders for this visit:  Sore throat -     POCT rapid strep A  Sinobronchitis -     azithromycin (ZITHROMAX Z-PAK) 250 MG tablet; Take 2 tablets  (500 mg) on  Day 1,  followed by 1 tablet (250 mg) once daily on Days 2 through 5.   Negative for strep Discussed symptomatic care Wait another 24 hours if not feeling better start zpak Drink plenty of fluids Follow up as needed  Iran Planas, PA-C

## 2021-12-27 ENCOUNTER — Ambulatory Visit: Payer: No Typology Code available for payment source

## 2021-12-27 DIAGNOSIS — N2889 Other specified disorders of kidney and ureter: Secondary | ICD-10-CM

## 2021-12-27 MED ORDER — GADOBUTROL 1 MMOL/ML IV SOLN
10.0000 mL | Freq: Once | INTRAVENOUS | Status: AC | PRN
  Administered 2021-12-27: 10 mL via INTRAVENOUS

## 2021-12-29 NOTE — Progress Notes (Signed)
Courtney Bailey, I am covering for [place] while she is out.  The great news is there is no worrisome masses or lesions.  They did notice a little bit of increased fat and enlargement of the liver.  We call this fatty liver or hepatic steatosis.  Recommendation is to work on [employer], regular exercise and weight loss to reduce the inflammation and fat in the liver so that this does not eventually cause scar tissue otherwise known as cirrhosis.  This is something you and Lesly Rubenstein can keep an eye on over time.

## 2022-01-02 ENCOUNTER — Other Ambulatory Visit (HOSPITAL_BASED_OUTPATIENT_CLINIC_OR_DEPARTMENT_OTHER): Payer: Self-pay

## 2022-01-05 ENCOUNTER — Other Ambulatory Visit (HOSPITAL_BASED_OUTPATIENT_CLINIC_OR_DEPARTMENT_OTHER): Payer: Self-pay

## 2022-01-11 ENCOUNTER — Ambulatory Visit (INDEPENDENT_AMBULATORY_CARE_PROVIDER_SITE_OTHER): Payer: No Typology Code available for payment source | Admitting: Physician Assistant

## 2022-01-11 ENCOUNTER — Other Ambulatory Visit (HOSPITAL_BASED_OUTPATIENT_CLINIC_OR_DEPARTMENT_OTHER): Payer: Self-pay

## 2022-01-11 ENCOUNTER — Encounter: Payer: Self-pay | Admitting: Physician Assistant

## 2022-01-11 VITALS — BP 125/77 | HR 90 | Ht 66.0 in | Wt 232.0 lb

## 2022-01-11 DIAGNOSIS — Z6839 Body mass index (BMI) 39.0-39.9, adult: Secondary | ICD-10-CM

## 2022-01-11 DIAGNOSIS — E1169 Type 2 diabetes mellitus with other specified complication: Secondary | ICD-10-CM | POA: Diagnosis not present

## 2022-01-11 LAB — POCT GLYCOSYLATED HEMOGLOBIN (HGB A1C): Hemoglobin A1C: 5.7 % — AB (ref 4.0–5.6)

## 2022-01-11 MED ORDER — TIRZEPATIDE 5 MG/0.5ML ~~LOC~~ SOAJ
5.0000 mg | SUBCUTANEOUS | 0 refills | Status: DC
  Filled 2022-01-11 – 2022-01-31 (×2): qty 2, 28d supply, fill #0

## 2022-01-11 NOTE — Progress Notes (Signed)
Established Patient Office Visit  Subjective   Patient ID: STEPHANIA MACFARLANE, female    DOB: Jun 22, 1985  Age: 36 y.o. MRN: [id number]  Chief Complaint  Patient presents with   Follow-up   Diabetes    HPI Pt is a 36 yo obese female with T2DM who presents to the clinic for follow up.   She is not checking her sugars. She was not able to tolerate 2mg  ozempic dose but 1mg  she is still having cravings and not losing weight. No GI side effects. Denies any CP, palpitations, headaches or vision changes. She is active but no exercise.   .. Active Ambulatory Problems    Diagnosis Date Noted   Class 2 severe obesity due to excess calories with serious comorbidity and body mass index (BMI) of 39.0 to 39.9 in adult (HCC) 12/17/2017   Seborrheic dermatitis of scalp 12/17/2017   Itchy scalp 12/17/2017   Eyelid dermatitis, allergic/contact, right 12/17/2017   Elevated fasting glucose 12/18/2017   Dyslipidemia, goal LDL below 70 12/18/2017   Perceived hearing changes 01/04/2018   ETD (Eustachian tube dysfunction), bilateral 01/04/2018   Depression, recurrent (HCC) 02/19/2018   Anxiety 02/19/2018   Insomnia 02/19/2018   Seasonal allergies 04/30/2018   Seasonal allergic rhinitis due to pollen 04/30/2018   Allergic conjunctivitis of both eyes 04/30/2018   Breast mass, right 11/22/2018   Frequent headaches 02/21/2019   Generalized anxiety disorder 02/27/2019   Inattention 03/05/2019   Left lumbar radiculitis 04/01/2019   Family history of blood clots 04/18/2019   Pain of left calf 04/18/2019   Abnormal glucose in pregnancy, antepartum 07/22/2019   Urinary frequency 09/19/2019   Iron deficiency anemia secondary to inadequate dietary iron intake 09/19/2019   Complicated grief 05/07/2020   Post partum depression 05/07/2020   No energy 09/27/2020   Diabetes mellitus (HCC) 09/29/2020   Dry skin 12/29/2020   Elevated liver enzymes 12/29/2020   Panic attacks 10/12/2021   Resolved Ambulatory  Problems    Diagnosis Date Noted   Pre-diabetes 12/21/2017   Spotting between menses 05/14/2018   [redacted] weeks gestation of pregnancy 09/19/2019   Past Medical History:  Diagnosis Date   Disc degeneration, lumbar    Sleep difficulties    Weight gain      Review of Systems  All other systems reviewed and are negative.     Objective:     BP 125/77   Pulse 90   Ht 5\' 6"  (1.676 m)   Wt 232 lb (105.2 kg)   SpO2 98%   BMI 37.45 kg/m  BP Readings from Last 3 Encounters:  01/11/22 125/77  11/16/21 122/72  10/12/21 112/65   Wt Readings from Last 3 Encounters:  01/11/22 232 lb (105.2 kg)  11/16/21 237 lb (107.5 kg)  10/12/21 243 lb (110.2 kg)      Physical Exam Constitutional:      Appearance: Normal appearance. She is obese.  Cardiovascular:     Rate and Rhythm: Normal rate and regular rhythm.  Pulmonary:     Effort: Pulmonary effort is normal.     Breath sounds: Normal breath sounds.  Neurological:     General: No focal deficit present.     Mental Status: She is alert.  Psychiatric:        Mood and Affect: Mood normal.      Results for orders placed or performed in visit on 01/11/22  POCT glycosylated hemoglobin (Hb A1C)  Result Value Ref Range   Hemoglobin A1C 5.7 (A)  4.0 - 5.6 %   HbA1c POC (<> result, manual entry)     HbA1c, POC (prediabetic range)     HbA1c, POC (controlled diabetic range)         Assessment & Plan:  .Marland KitchenMckinnley was seen today for follow-up and diabetes.  Diagnoses and all orders for this visit:  Type 2 diabetes mellitus with other specified complication, without long-term current use of insulin (HCC) -     POCT glycosylated hemoglobin (Hb A1C) -     tirzepatide (MOUNJARO) 5 MG/0.5ML Pen; Inject 5 mg into the skin once a week.  Class 2 severe obesity due to excess calories with serious comorbidity and body mass index (BMI) of 39.0 to 39.9 in adult (HCC) -     tirzepatide (MOUNJARO) 5 MG/0.5ML Pen; Inject 5 mg into the skin once a  week.    A1C to goal but up some Continue on metformin Stop ozempic Start mounjaro Discussed exercise and diet Not on statin right now Declined covid booster Flu shot UTD Follow up in 3 months  Tandy Gaw, PA-C

## 2022-01-20 ENCOUNTER — Other Ambulatory Visit (HOSPITAL_BASED_OUTPATIENT_CLINIC_OR_DEPARTMENT_OTHER): Payer: Self-pay

## 2022-01-25 ENCOUNTER — Telehealth: Payer: Self-pay | Admitting: Physician Assistant

## 2022-01-25 DIAGNOSIS — Z20828 Contact with and (suspected) exposure to other viral communicable diseases: Secondary | ICD-10-CM

## 2022-01-25 MED ORDER — OSELTAMIVIR PHOSPHATE 75 MG PO CAPS: 75.0000 mg | ORAL_CAPSULE | Freq: Every day | ORAL | 0 refills | Status: DC

## 2022-01-25 NOTE — Telephone Encounter (Signed)
Exposure to flu-sent tamiflu for prevention

## 2022-01-31 ENCOUNTER — Other Ambulatory Visit (HOSPITAL_BASED_OUTPATIENT_CLINIC_OR_DEPARTMENT_OTHER): Payer: Self-pay

## 2022-02-01 ENCOUNTER — Other Ambulatory Visit (HOSPITAL_BASED_OUTPATIENT_CLINIC_OR_DEPARTMENT_OTHER): Payer: Self-pay

## 2022-02-02 ENCOUNTER — Other Ambulatory Visit (HOSPITAL_BASED_OUTPATIENT_CLINIC_OR_DEPARTMENT_OTHER): Payer: Self-pay

## 2022-02-15 ENCOUNTER — Other Ambulatory Visit (HOSPITAL_BASED_OUTPATIENT_CLINIC_OR_DEPARTMENT_OTHER): Payer: Self-pay

## 2022-02-23 ENCOUNTER — Other Ambulatory Visit: Payer: Self-pay | Admitting: Physician Assistant

## 2022-02-23 ENCOUNTER — Other Ambulatory Visit (HOSPITAL_BASED_OUTPATIENT_CLINIC_OR_DEPARTMENT_OTHER): Payer: Self-pay

## 2022-02-23 DIAGNOSIS — E1169 Type 2 diabetes mellitus with other specified complication: Secondary | ICD-10-CM

## 2022-02-23 MED ORDER — MOUNJARO 5 MG/0.5ML ~~LOC~~ SOAJ
5.0000 mg | SUBCUTANEOUS | 0 refills | Status: DC
  Filled 2022-03-02: qty 2, 28d supply, fill #0

## 2022-02-27 ENCOUNTER — Other Ambulatory Visit (HOSPITAL_BASED_OUTPATIENT_CLINIC_OR_DEPARTMENT_OTHER): Payer: Self-pay

## 2022-02-27 ENCOUNTER — Encounter: Payer: Self-pay | Admitting: Physician Assistant

## 2022-02-27 MED ORDER — TIRZEPATIDE 7.5 MG/0.5ML ~~LOC~~ SOAJ
7.5000 mg | SUBCUTANEOUS | 0 refills | Status: DC
  Filled 2022-02-27: qty 2, 28d supply, fill #0
  Filled 2022-03-27: qty 2, 28d supply, fill #1

## 2022-03-01 ENCOUNTER — Other Ambulatory Visit (HOSPITAL_BASED_OUTPATIENT_CLINIC_OR_DEPARTMENT_OTHER): Payer: Self-pay

## 2022-03-02 ENCOUNTER — Other Ambulatory Visit (HOSPITAL_BASED_OUTPATIENT_CLINIC_OR_DEPARTMENT_OTHER): Payer: Self-pay

## 2022-04-26 ENCOUNTER — Other Ambulatory Visit (HOSPITAL_BASED_OUTPATIENT_CLINIC_OR_DEPARTMENT_OTHER): Payer: Self-pay

## 2022-04-26 ENCOUNTER — Other Ambulatory Visit: Payer: Self-pay

## 2022-04-26 ENCOUNTER — Ambulatory Visit (INDEPENDENT_AMBULATORY_CARE_PROVIDER_SITE_OTHER): Payer: 59 | Admitting: Physician Assistant

## 2022-04-26 VITALS — BP 102/66 | HR 83 | Ht 66.0 in | Wt 223.0 lb

## 2022-04-26 DIAGNOSIS — E1169 Type 2 diabetes mellitus with other specified complication: Secondary | ICD-10-CM

## 2022-04-26 DIAGNOSIS — E785 Hyperlipidemia, unspecified: Secondary | ICD-10-CM

## 2022-04-26 LAB — POCT GLYCOSYLATED HEMOGLOBIN (HGB A1C): Hemoglobin A1C: 5.3 % (ref 4.0–5.6)

## 2022-04-26 MED ORDER — TIRZEPATIDE 10 MG/0.5ML ~~LOC~~ SOAJ
10.0000 mg | SUBCUTANEOUS | 0 refills | Status: DC
  Filled 2022-04-26: qty 2, 28d supply, fill #0

## 2022-04-26 MED ORDER — METFORMIN HCL ER 500 MG PO TB24
500.0000 mg | ORAL_TABLET | Freq: Every day | ORAL | 3 refills | Status: DC
  Filled 2022-04-26: qty 90, 90d supply, fill #0

## 2022-04-26 NOTE — Progress Notes (Signed)
Established Patient Office Visit  Subjective   Patient ID: Courtney Bailey, female    DOB: [birth year]  Age: 37 y.o. MRN: [id number]  Chief Complaint  Patient presents with   Follow-up    HPI Pt is a 37 yo obese female with T2DM, HLD who presents to the clinic for follow up. She is doing great on mounjaro. She is eating better and exercising 3 times a week. She denies any side effects. She is not having any CP, palpitaitons, headaches, or vision changes. She does not check her sugars. She is compliant with medications.   .. Active Ambulatory Problems    Diagnosis Date Noted   Class 2 severe obesity due to excess calories with serious comorbidity and body mass index (BMI) of 35.0 to 35.9 in adult ([place]) 12/17/2017   Seborrheic dermatitis of scalp 12/17/2017   Itchy scalp 12/17/2017   Eyelid dermatitis, allergic/contact, right 12/17/2017   Elevated fasting glucose 12/18/2017   Dyslipidemia, goal LDL below 70 12/18/2017   Perceived hearing changes 01/04/2018   ETD (Eustachian tube dysfunction), bilateral 01/04/2018   Depression, recurrent ([place]) 02/19/2018   Anxiety 02/19/2018   Insomnia 02/19/2018   Seasonal allergies 04/30/2018   Seasonal allergic rhinitis due to pollen 04/30/2018   Allergic conjunctivitis of both eyes 04/30/2018   Breast mass, right 11/22/2018   Frequent headaches 02/21/2019   Generalized anxiety disorder 02/27/2019   Inattention 03/05/2019   Left lumbar radiculitis 04/01/2019   Family history of blood clots 04/18/2019   Pain of left calf 04/18/2019   Abnormal glucose in pregnancy, antepartum 07/22/2019   Urinary frequency 09/19/2019   Iron deficiency anemia secondary to inadequate dietary iron intake [id number]   Complicated grief [id number]   Post partum depression 05/07/2020   No energy 09/27/2020   Diabetes mellitus ([place]) 09/29/2020   Dry skin 12/29/2020   Elevated liver enzymes 12/29/2020   Panic attacks 10/12/2021   Resolved Ambulatory Problems     Diagnosis Date Noted   Pre-diabetes 12/21/2017   Spotting between menses 05/14/2018   [redacted] weeks gestation of pregnancy 09/19/2019   Past Medical History:  Diagnosis Date   Disc degeneration, lumbar    Sleep difficulties    Weight gain      Review of Systems  All other systems reviewed and are negative.     Objective:     BP 102/66   Pulse 83   Ht 5\' 6"  (1.676 m)   Wt 223 lb (101.2 kg)   SpO2 98%   BMI 35.99 kg/m  BP Readings from Last 3 Encounters:  04/26/22 102/66  01/11/22 125/77  11/16/21 122/72   Wt Readings from Last 3 Encounters:  04/26/22 223 lb (101.2 kg)  01/11/22 232 lb (105.2 kg)  11/16/21 237 lb (107.5 kg)   .Marland Kitchen Results for orders placed or performed in visit on 04/26/22  POCT glycosylated hemoglobin (Hb A1C)  Result Value Ref Range   Hemoglobin A1C 5.3 4.0 - 5.6 %   HbA1c POC (<> result, manual entry)     HbA1c, POC (prediabetic range)     HbA1c, POC (controlled diabetic range)     .Marland Kitchen   Physical Exam Constitutional:      Appearance: Normal appearance.  HENT:     Head: Normocephalic.  Cardiovascular:     Rate and Rhythm: Normal rate and regular rhythm.  Pulmonary:     Effort: Pulmonary effort is normal.     Breath sounds: Normal breath sounds.  Musculoskeletal:  Right lower leg: No edema.     Left lower leg: No edema.  Neurological:     General: No focal deficit present.     Mental Status: She is alert and oriented to person, place, and time.  Psychiatric:        Mood and Affect: Mood normal.        Assessment & Plan:  .Marland KitchenNavya was seen today for follow-up.  Diagnoses and all orders for this visit:  Type 2 diabetes mellitus with other specified complication, without long-term current use of insulin ([facility]) -     POCT glycosylated hemoglobin (Hb A1C) -     tirzepatide (MOUNJARO) 10 MG/0.5ML Pen; Inject 10 mg into the skin once a week. -     metFORMIN (GLUCOPHAGE-XR) 500 MG 24 hr tablet; Take 1 tablet (500 mg total) by mouth  daily with breakfast.  Class 2 severe obesity due to excess calories with serious comorbidity and body mass index (BMI) of 35.0 to 35.9 in adult ([facility])  Dyslipidemia, goal LDL below 70   A1C to goal Continue on same medications Increase mounjaro to 10mg  to see if can help more with weight loss BP to goal On statin, LDL to goal Needs eye exam .. Diabetic Foot Exam - Simple   Simple Foot Form Diabetic Foot exam was performed with the following findings: Yes 04/26/2022  8:20 AM  Visual Inspection No deformities, no ulcerations, no other skin breakdown bilaterally: Yes Sensation Testing Intact to touch and monofilament testing bilaterally: Yes Pulse Check Posterior Tibialis and Dorsalis pulse intact bilaterally: Yes Comments    Declined flu/covid booster Follow up in 3 months   Iran Planas, PA-C

## 2022-04-27 ENCOUNTER — Other Ambulatory Visit (HOSPITAL_BASED_OUTPATIENT_CLINIC_OR_DEPARTMENT_OTHER): Payer: Self-pay

## 2022-05-09 ENCOUNTER — Encounter: Payer: Self-pay | Admitting: Physician Assistant

## 2022-05-09 NOTE — Telephone Encounter (Signed)
Patient scheduled.

## 2022-05-10 ENCOUNTER — Encounter: Payer: Self-pay | Admitting: Physician Assistant

## 2022-05-10 ENCOUNTER — Other Ambulatory Visit (HOSPITAL_BASED_OUTPATIENT_CLINIC_OR_DEPARTMENT_OTHER): Payer: Self-pay

## 2022-05-10 ENCOUNTER — Ambulatory Visit (INDEPENDENT_AMBULATORY_CARE_PROVIDER_SITE_OTHER): Payer: 59 | Admitting: Physician Assistant

## 2022-05-10 VITALS — BP 112/69 | HR 85 | Temp 98.1°F | Ht 65.0 in | Wt 225.0 lb

## 2022-05-10 DIAGNOSIS — L509 Urticaria, unspecified: Secondary | ICD-10-CM | POA: Diagnosis not present

## 2022-05-10 MED ORDER — METHYLPREDNISOLONE 4 MG PO TBPK
ORAL_TABLET | ORAL | 0 refills | Status: DC
Start: 2022-05-10 — End: 2022-06-27
  Filled 2022-05-10: qty 21, 6d supply, fill #0

## 2022-05-10 MED ORDER — TRIAMCINOLONE ACETONIDE 0.1 % EX CREA
1.0000 | TOPICAL_CREAM | Freq: Two times a day (BID) | CUTANEOUS | 0 refills | Status: DC
Start: 2022-05-10 — End: 2022-06-27
  Filled 2022-05-10: qty 80, 40d supply, fill #0

## 2022-05-10 MED ORDER — METHYLPREDNISOLONE SODIUM SUCC 125 MG IJ SOLR
125.0000 mg | Freq: Once | INTRAMUSCULAR | Status: AC
Start: 2022-05-10 — End: 2022-05-10
  Administered 2022-05-10: 125 mg via INTRAMUSCULAR

## 2022-05-10 NOTE — Progress Notes (Signed)
Acute Office Visit  Subjective:     Patient ID: Courtney Bailey, female    DOB: 14-Feb-1985, 37 y.o.   MRN: [id number]  Chief Complaint  Patient presents with   Rash    HPI Patient is in today for itchy rash on neck, bilateral arms, abdomen. Spares face and legs. Started Sunday afternoon after she got home from the park. Taken benadryl and helped some. Took zyrtec daily since. She denies any URI symptoms. Her daughter does not have any rash or itching. Denies any lips or tongue swelling. Denies any SOB or cough. She is not aware of any new lotions, detergents, creams, soaps, washes. She did eat spicy peanuts on Sunday but she has never had allergy to peanuts. She has not eaten any since. She is on mounjaro but tolerated well and last shot was Monday. Rash started before a new shot of mounjaro.    .. Active Ambulatory Problems    Diagnosis Date Noted   Class 2 severe obesity due to excess calories with serious comorbidity and body mass index (BMI) of 35.0 to 35.9 in adult 12/17/2017   Seborrheic dermatitis of scalp 12/17/2017   Itchy scalp 12/17/2017   Eyelid dermatitis, allergic/contact, right 12/17/2017   Elevated fasting glucose 12/18/2017   Dyslipidemia, goal LDL below 70 12/18/2017   Perceived hearing changes 01/04/2018   ETD (Eustachian tube dysfunction), bilateral 01/04/2018   Depression, recurrent 02/19/2018   Anxiety 02/19/2018   Insomnia 02/19/2018   Seasonal allergies 04/30/2018   Seasonal allergic rhinitis due to pollen 04/30/2018   Allergic conjunctivitis of both eyes 04/30/2018   Breast mass, right 11/22/2018   Frequent headaches 02/21/2019   Generalized anxiety disorder 02/27/2019   Inattention 03/05/2019   Left lumbar radiculitis 04/01/2019   Family history of blood clots 04/18/2019   Pain of left calf 04/18/2019   Abnormal glucose in pregnancy, antepartum 07/22/2019   Urinary frequency 09/19/2019   Iron deficiency anemia secondary to inadequate dietary iron  intake 09/19/2019   Complicated grief 05/07/2020   Post partum depression 05/07/2020   No energy 09/27/2020   Diabetes mellitus 09/29/2020   Dry skin 12/29/2020   Elevated liver enzymes 12/29/2020   Panic attacks 10/12/2021   Resolved Ambulatory Problems    Diagnosis Date Noted   Pre-diabetes 12/21/2017   Spotting between menses 05/14/2018   [redacted] weeks gestation of pregnancy 09/19/2019   Past Medical History:  Diagnosis Date   Disc degeneration, lumbar    Sleep difficulties    Weight gain      ROS  See HPI.     Objective:    BP 112/69   Pulse 85   Temp 98.1 F (36.7 C) (Oral)   Ht 5\' 5"  (1.651 m)   Wt 225 lb (102.1 kg)   SpO2 99%   BMI 37.44 kg/m  BP Readings from Last 3 Encounters:  05/10/22 112/69  04/26/22 102/66  01/11/22 125/77   Wt Readings from Last 3 Encounters:  05/10/22 225 lb (102.1 kg)  04/26/22 223 lb (101.2 kg)  01/11/22 232 lb (105.2 kg)      Physical Exam Erythematous raised linear areas on neck, bilateral arms, trunk. Large area of erythema with excoriations on left flank.  Blisters in clusters with erythema on bilateral hands      Assessment & Plan:  .Marland KitchenHillory was seen today for rash.  Diagnoses and all orders for this visit:  Hives -     methylPREDNISolone (MEDROL DOSEPAK) 4 MG TBPK tablet; Take as  directed by package insert. -     methylPREDNISolone sodium succinate (SOLU-MEDROL) 125 mg/2 mL injection 125 mg -     triamcinolone cream (KENALOG) 0.1 %; Apply 1 Application topically 2 (two) times daily. As needed over rash.   Unclear etiology  No red flags Solumedrol IM in office and medrol dose pack As needed topical steroid for itchy areas ? If grass at the park or something came in contact with outside   [facility] - [place], [place]

## 2022-05-10 NOTE — Patient Instructions (Signed)
Hives Hives (urticaria) are itchy, red, swollen areas of skin. They can show up on any part of the body. They often fade within 24 hours (acute hives). If you get new hives after the old ones fade and the cycle goes on for many days or weeks, it is called chronic hives. Hives do not spread from person to person (are not contagious). Hives can happen when your body reacts to something you are allergic to (allergen) or to something that irritates your skin. When you are exposed to something that triggers hives, your body releases a chemical called histamine. This causes redness, itching, and swelling. Hives can show up right after you are exposed to a trigger or hours later. What are the causes? Hives may be caused by: Food allergies. Insect bites or stings. Allergies to pollen or pets. Spending time in sunlight, heat, or cold (exposure). Exercise. Stress. You can also get hives from other conditions and treatments. These include: Viruses, such as the common cold. Bacterial infections, such as urinary tract infections and strep throat. Certain medicines. Contact with latex or chemicals. Allergy shots. Blood transfusions. In some cases, the cause of hives is not known (idiopathic hives). What increases the risk? You are more likely to get hives if: You are female. You have food allergies. Hives are more common if you are allergic to citrus fruits, milk, eggs, peanuts, tree nuts, or shellfish. You are allergic to: Medicines. Latex. Insects. Animals. Pollen. What are the signs or symptoms? Common symptoms of hives include raised, itchy, red or white bumps or patches on your skin. These areas may: Become large and swollen (welts). Quickly change shape and location. This may happen more than once. Be separate hives or connect over a large area of skin. Sting or become painful. Turn white when pressed in the center (blanch). In severe cases, your hands, feet, and face may also become  swollen. This may happen if hives form deeper in your skin. How is this diagnosed? Hives may be diagnosed based on your symptoms, medical history, and a physical exam. You may have skin, pee (urine), or blood tests done. These can help find out what is causing your hives and rule out other health issues. You may also have a biopsy done. This is when a small piece of skin is removed for testing. How is this treated? Treatment for hives depends on the cause and on how severe your symptoms are. You may be told to use cool, wet cloths (cool compresses) or to take cool showers to relieve itching. Treatment may also include: Medicines to help: Relieve itching (antihistamines). Reduce swelling (corticosteroids). Treat infection (antibiotics). An injectable medicine called omalizumab. You may need this if you have chronic idiopathic hives and still have symptoms even after you are treated with antihistamines. In severe cases, you may need to use a device filled with medicine that gives an emergency shot of epinephrine (auto-injector pen) to prevent a very bad allergic reaction (anaphylactic reaction). Follow these instructions at home: Medicines Take and apply over-the-counter and prescription medicines only as told by your health care provider. If you were prescribed antibiotics, take them as told by your provider. Do not stop using the antibiotic even if you start to feel better. Skin care Apply cool compresses to the affected areas. Do not scratch or rub your skin. General instructions Do not take hot showers or baths. This can make itching worse. Do not wear tight-fitting clothing. Use sunscreen. Wear protective clothing when you are outside. Avoid   anything that causes your hives. Keep a journal to help track what causes your hives. Write down: What medicines you take. What you eat and drink. What products you use on your skin. Keep all follow-up visits. Your provider will track how well  treatment is working. Contact a health care provider if: Your symptoms do not get better with medicine. Your joints are painful or swollen. You have a fever. You have pain in your abdomen. Get help right away if: Your tongue, lips, or eyelids swell. Your chest or throat feels tight. You have trouble breathing or swallowing. These symptoms may be an emergency. Use the auto-injector pen right away. Then call 911. Do not wait to see if the symptoms will go away. Do not drive yourself to the hospital. This information is not intended to replace advice given to you by your health care provider. Make sure you discuss any questions you have with your health care provider. Document Revised: 10/13/2021 Document Reviewed: 10/04/2021 Elsevier Patient Education  2023 [employer]  

## 2022-05-23 ENCOUNTER — Other Ambulatory Visit: Payer: Self-pay | Admitting: Physician Assistant

## 2022-05-23 DIAGNOSIS — E1169 Type 2 diabetes mellitus with other specified complication: Secondary | ICD-10-CM

## 2022-05-24 ENCOUNTER — Other Ambulatory Visit (HOSPITAL_BASED_OUTPATIENT_CLINIC_OR_DEPARTMENT_OTHER): Payer: Self-pay

## 2022-05-24 MED ORDER — MOUNJARO 10 MG/0.5ML ~~LOC~~ SOAJ
10.0000 mg | SUBCUTANEOUS | 0 refills | Status: DC
Start: 2022-05-24 — End: 2022-06-27
  Filled 2022-05-24 – 2022-06-12 (×2): qty 2, 28d supply, fill #0

## 2022-06-01 ENCOUNTER — Other Ambulatory Visit (HOSPITAL_BASED_OUTPATIENT_CLINIC_OR_DEPARTMENT_OTHER): Payer: Self-pay

## 2022-06-12 ENCOUNTER — Other Ambulatory Visit (HOSPITAL_BASED_OUTPATIENT_CLINIC_OR_DEPARTMENT_OTHER): Payer: Self-pay

## 2022-06-16 ENCOUNTER — Encounter: Payer: Self-pay | Admitting: Physician Assistant

## 2022-06-23 ENCOUNTER — Other Ambulatory Visit (HOSPITAL_BASED_OUTPATIENT_CLINIC_OR_DEPARTMENT_OTHER): Payer: Self-pay

## 2022-06-27 ENCOUNTER — Encounter: Payer: Self-pay | Admitting: Physician Assistant

## 2022-06-27 ENCOUNTER — Other Ambulatory Visit (HOSPITAL_BASED_OUTPATIENT_CLINIC_OR_DEPARTMENT_OTHER): Payer: Self-pay

## 2022-06-27 DIAGNOSIS — E1169 Type 2 diabetes mellitus with other specified complication: Secondary | ICD-10-CM

## 2022-06-27 MED ORDER — TIRZEPATIDE 12.5 MG/0.5ML ~~LOC~~ SOAJ
12.5000 mg | SUBCUTANEOUS | 0 refills | Status: DC
Start: 2022-06-27 — End: 2022-07-26
  Filled 2022-06-27: qty 2, 28d supply, fill #0

## 2022-06-27 NOTE — Telephone Encounter (Signed)
We can go up on the dose but if she is not losing weight then she needs to make an appt so they can discuss strategy for weight loss for her to be successful.     Meds ordered this encounter  Medications   tirzepatide (MOUNJARO) 12.5 MG/0.5ML Pen    Sig: Inject 12.5 mg into the skin once a week.    Dispense:  2 mL    Refill:  0

## 2022-07-26 ENCOUNTER — Ambulatory Visit (INDEPENDENT_AMBULATORY_CARE_PROVIDER_SITE_OTHER): Payer: 59 | Admitting: Physician Assistant

## 2022-07-26 ENCOUNTER — Encounter: Payer: Self-pay | Admitting: Physician Assistant

## 2022-07-26 ENCOUNTER — Other Ambulatory Visit (HOSPITAL_BASED_OUTPATIENT_CLINIC_OR_DEPARTMENT_OTHER): Payer: Self-pay

## 2022-07-26 VITALS — BP 105/65 | HR 89 | Ht 65.0 in | Wt 225.0 lb

## 2022-07-26 DIAGNOSIS — R112 Nausea with vomiting, unspecified: Secondary | ICD-10-CM

## 2022-07-26 DIAGNOSIS — R197 Diarrhea, unspecified: Secondary | ICD-10-CM

## 2022-07-26 DIAGNOSIS — Z6837 Body mass index (BMI) 37.0-37.9, adult: Secondary | ICD-10-CM

## 2022-07-26 DIAGNOSIS — J069 Acute upper respiratory infection, unspecified: Secondary | ICD-10-CM

## 2022-07-26 DIAGNOSIS — E1169 Type 2 diabetes mellitus with other specified complication: Secondary | ICD-10-CM

## 2022-07-26 LAB — POCT GLYCOSYLATED HEMOGLOBIN (HGB A1C): Hemoglobin A1C: 5.6 % (ref 4.0–5.6)

## 2022-07-26 MED ORDER — TIRZEPATIDE 7.5 MG/0.5ML ~~LOC~~ SOAJ
7.5000 mg | SUBCUTANEOUS | 0 refills | Status: DC
Start: 2022-07-26 — End: 2022-10-31
  Filled 2022-07-26 – 2022-08-17 (×2): qty 2, 28d supply, fill #0
  Filled 2022-09-13: qty 2, 28d supply, fill #1
  Filled 2022-10-11 – 2022-10-12 (×2): qty 2, 28d supply, fill #2

## 2022-07-26 NOTE — Progress Notes (Signed)
Established Patient Office Visit  Subjective   Patient ID: Courtney Bailey, female    DOB: [birth year]  Age: 37 y.o. MRN: [id number]  Chief Complaint  Patient presents with   Follow-up   Diabetes    Pt is a 37 yo obese female with T2DM, HLD who presents to the clinic for follow up. She has not picked up the newest dose of Monjaro due to fear of side effect flare ups. She has not taken the lower dose (7.5mg )  in 2wks since an episode of n/v, diarrhea, reflux, bloating, and belching lasting 36hrs that left her bed bound. This episode was preceded by significantly increased caffeine intake from her baseline. Since not taking it, she has also experienced one migraine with flashing lights.   She states she has also tried intermittent fasting but finds it difficult to manage because she gets hungry when she is preparing food to feed her daughter so she breaks the fast.  She does not check her BG at home and denies hypoglycemic events. Her diet mainly consists of boiled eggs, berries, toast, chicken, and vegetables. She has been avoiding fried foods. She admits she could exercise more. Currently she takes her daughter to the playground or on walks but not daily. She denies CP, SOB, vision changes, swelling.  She also complains of a allergies or a cold x3 days for which she has been taking claritin-D. Her daughter and mother are experiencing similar symptoms.    .. Active Ambulatory Problems    Diagnosis Date Noted   Class 2 severe obesity due to excess calories with serious comorbidity and body mass index (BMI) of 37.0 to 37.9 in adult ([facility]) 12/17/2017   Seborrheic dermatitis of scalp 12/17/2017   Itchy scalp 12/17/2017   Eyelid dermatitis, allergic/contact, right 12/17/2017   Elevated fasting glucose 12/18/2017   Dyslipidemia, goal LDL below 70 12/18/2017   Perceived hearing changes 01/04/2018   ETD (Eustachian tube dysfunction), bilateral 01/04/2018   Depression, recurrent ([facility])  02/19/2018   Anxiety 02/19/2018   Insomnia 02/19/2018   Seasonal allergies 04/30/2018   Seasonal allergic rhinitis due to pollen 04/30/2018   Allergic conjunctivitis of both eyes 04/30/2018   Breast mass, right 11/22/2018   Frequent headaches 02/21/2019   Generalized anxiety disorder 02/27/2019   Inattention 03/05/2019   Left lumbar radiculitis 04/01/2019   Family history of blood clots 04/18/2019   Pain of left calf 04/18/2019   Abnormal glucose in pregnancy, antepartum 07/22/2019   Urinary frequency 09/19/2019   Iron deficiency anemia secondary to inadequate dietary iron intake 09/19/2019   Complicated grief 05/07/2020   Post partum depression 05/07/2020   No energy 09/27/2020   Diabetes mellitus ([facility]) 09/29/2020   Dry skin 12/29/2020   Elevated liver enzymes 12/29/2020   Panic attacks 10/12/2021   Nausea vomiting and diarrhea 07/26/2022   Resolved Ambulatory Problems    Diagnosis Date Noted   Pre-diabetes 12/21/2017   Spotting between menses 05/14/2018   [redacted] weeks gestation of pregnancy 09/19/2019   Past Medical History:  Diagnosis Date   Disc degeneration, lumbar    Sleep difficulties    Weight gain      Review of Systems  HENT:  Positive for congestion. Negative for sinus pain and sore throat.   Eyes:  Negative for blurred vision and double vision.  Respiratory:  Negative for shortness of breath.   Cardiovascular:  Negative for chest pain, palpitations and leg swelling.  Gastrointestinal:  Positive for heartburn. Negative for constipation, diarrhea, nausea  and vomiting.  Neurological:  Negative for dizziness, sensory change and headaches.  Endo/Heme/Allergies:  Positive for environmental allergies.      Objective:     BP 105/65 (BP Location: Left Arm, Patient Position: Sitting, Cuff Size: Normal)   Pulse 89   Ht 5\' 5"  (1.651 m)   Wt 225 lb (102.1 kg)   SpO2 98%   BMI 37.44 kg/m  BP Readings from Last 3 Encounters:  07/26/22 105/65  05/10/22 112/69   04/26/22 102/66   Wt Readings from Last 3 Encounters:  07/26/22 225 lb (102.1 kg)  05/10/22 225 lb (102.1 kg)  04/26/22 223 lb (101.2 kg)      Physical Exam Constitutional:      Appearance: Normal appearance. She is obese.  HENT:     Nose: Congestion and rhinorrhea present.     Comments: Clear Eyes:     Conjunctiva/sclera: Conjunctivae normal.  Cardiovascular:     Rate and Rhythm: Normal rate and regular rhythm.     Pulses: Normal pulses.     Heart sounds: Normal heart sounds.  Pulmonary:     Effort: Pulmonary effort is normal.     Breath sounds: Normal breath sounds.  Musculoskeletal:     Right lower leg: No edema.     Left lower leg: No edema.  Neurological:     General: No focal deficit present.     Mental Status: She is alert and oriented to person, place, and time.      Results for orders placed or performed in visit on 07/26/22  POCT HgB A1C  Result Value Ref Range   Hemoglobin A1C 5.6 4.0 - 5.6 %   HbA1c POC (<> result, manual entry)     HbA1c, POC (prediabetic range)     HbA1c, POC (controlled diabetic range)        Assessment & Plan:  .Marland KitchenBettyanne was seen today for follow-up and diabetes.  Diagnoses and all orders for this visit:  Type 2 diabetes mellitus with other specified complication, without long-term current use of insulin ([facility]) -     POCT HgB A1C -     tirzepatide (MOUNJARO) 7.5 MG/0.5ML Pen; Inject 7.5 mg into the skin once a week.  Class 2 severe obesity due to excess calories with serious comorbidity and body mass index (BMI) of 37.0 to 37.9 in adult ([facility]) -     tirzepatide (MOUNJARO) 7.5 MG/0.5ML Pen; Inject 7.5 mg into the skin once a week.  Viral upper respiratory tract infection  Nausea vomiting and diarrhea   A1C up from last time but still to goal Continue on same medications  No wt loss since last visit. Discussed adjusting timeline for intermittent fasting and ways to increase activity. Restart mounjaro at 7.5mg . Beware of  excess intake of sugar, caffeine, greasy foods to avoid side effect flare ups.  Suggested mucinex-D for cold sxs. Adequate hydration with water.  BP to goal On statin, LDL to goal per last visit Foot exam UTD Needs eye exam  Return in about 3 months (around 10/26/2022).    Tandy Gaw, PA-C

## 2022-08-07 ENCOUNTER — Other Ambulatory Visit (HOSPITAL_BASED_OUTPATIENT_CLINIC_OR_DEPARTMENT_OTHER): Payer: Self-pay

## 2022-08-08 ENCOUNTER — Other Ambulatory Visit (HOSPITAL_BASED_OUTPATIENT_CLINIC_OR_DEPARTMENT_OTHER): Payer: Self-pay

## 2022-08-17 ENCOUNTER — Other Ambulatory Visit (HOSPITAL_BASED_OUTPATIENT_CLINIC_OR_DEPARTMENT_OTHER): Payer: Self-pay

## 2022-08-17 ENCOUNTER — Other Ambulatory Visit: Payer: Self-pay

## 2022-09-04 ENCOUNTER — Telehealth: Payer: Self-pay

## 2022-09-04 NOTE — Telephone Encounter (Signed)
Patient reminded to call and schedule her DM eye exam .

## 2022-09-25 ENCOUNTER — Encounter: Payer: Self-pay | Admitting: Physician Assistant

## 2022-09-25 ENCOUNTER — Ambulatory Visit: Payer: 59 | Admitting: Physician Assistant

## 2022-09-25 VITALS — BP 105/70 | HR 69 | Ht 65.0 in | Wt 221.0 lb

## 2022-09-25 DIAGNOSIS — Z79899 Other long term (current) drug therapy: Secondary | ICD-10-CM

## 2022-09-25 DIAGNOSIS — E785 Hyperlipidemia, unspecified: Secondary | ICD-10-CM | POA: Diagnosis not present

## 2022-09-25 DIAGNOSIS — Z6836 Body mass index (BMI) 36.0-36.9, adult: Secondary | ICD-10-CM

## 2022-09-25 DIAGNOSIS — E1169 Type 2 diabetes mellitus with other specified complication: Secondary | ICD-10-CM

## 2022-09-25 DIAGNOSIS — Z0000 Encounter for general adult medical examination without abnormal findings: Secondary | ICD-10-CM

## 2022-09-25 NOTE — Patient Instructions (Signed)

## 2022-09-25 NOTE — Progress Notes (Signed)
Complete physical exam  Patient: Courtney Bailey   DOB: 1986/01/20   37 y.o. Female  MRN: [id number]  Subjective:    Chief Complaint  Patient presents with   Annual Exam    Courtney Bailey is a 37 y.o. female who presents today for a complete physical exam. She reports consuming a general and low sugar and carb  diet.  Pt walks a few times a week.  She generally feels well. She reports sleeping well. She does not have additional problems to discuss today.    Most recent fall risk assessment:    09/25/2022    2:15 PM  Fall Risk   Falls in the past year? 0  Number falls in past yr: 0  Injury with Fall? 0  Risk for fall due to : No Fall Risks  Follow up Falls evaluation completed     Most recent depression screenings:    09/25/2022    2:15 PM 07/26/2022    8:06 AM  PHQ 2/9 Scores  PHQ - 2 Score 0 0    Vision:Within last year and Dental: No current dental problems and Receives regular dental care  Patient Active Problem List   Diagnosis Date Noted   Nausea vomiting and diarrhea 07/26/2022   Panic attacks 10/12/2021   Dry skin 12/29/2020   Elevated liver enzymes 12/29/2020   Diabetes mellitus (HCC) 09/29/2020   No energy 09/27/2020   Complicated grief 05/07/2020   Post partum depression 05/07/2020   Urinary frequency 09/19/2019   Iron deficiency anemia secondary to inadequate dietary iron intake 09/19/2019   Abnormal glucose in pregnancy, antepartum 07/22/2019   Family history of blood clots 04/18/2019   Pain of left calf 04/18/2019   Left lumbar radiculitis 04/01/2019   Inattention 03/05/2019   Generalized anxiety disorder 02/27/2019   Frequent headaches 02/21/2019   Breast mass, right 11/22/2018   Seasonal allergies 04/30/2018   Seasonal allergic rhinitis due to pollen 04/30/2018   Allergic conjunctivitis of both eyes 04/30/2018   Depression, recurrent (HCC) 02/19/2018   Anxiety 02/19/2018   Insomnia 02/19/2018   Perceived hearing changes 01/04/2018   ETD  (Eustachian tube dysfunction), bilateral 01/04/2018   Elevated fasting glucose 12/18/2017   Dyslipidemia, goal LDL below 70 12/18/2017   Class 2 severe obesity due to excess calories with serious comorbidity and body mass index (BMI) of 36.0 to 36.9 in adult (HCC) 12/17/2017   Seborrheic dermatitis of scalp 12/17/2017   Itchy scalp 12/17/2017   Eyelid dermatitis, allergic/contact, right 12/17/2017   Past Medical History:  Diagnosis Date   Disc degeneration, lumbar    Sleep difficulties    Weight gain    Past Surgical History:  Procedure Laterality Date   COLONOSCOPY     Family History  Problem Relation Age of Onset   Hypothyroidism Sister    Breast cancer Neg Hx    Allergies  Allergen Reactions   Penicillin G Other (See Comments)    Does not know reaction. Occurred in early childhood     Trazodone Other (See Comments)    Nightmares   Escitalopram Oxalate Anxiety    Worsening anxiety symptoms.      Patient Care Team: Nolene Ebbs as PCP - General (Family Medicine)   Outpatient Medications Prior to Visit  Medication Sig   atorvastatin (LIPITOR) 10 MG tablet Take 1 tablet (10 mg total) by mouth daily.   Iron Combinations (IRON COMPLEX PO) Take 65 mg by mouth.   tirzepatide (MOUNJARO) 7.5  MG/0.5ML Pen Inject 7.5 mg into the skin once a week.   No facility-administered medications prior to visit.    Review of Systems  All other systems reviewed and are negative.         Objective:     BP 105/70   Pulse 69   Ht 5\' 5"  (1.651 m)   Wt 221 lb (100.2 kg)   SpO2 99%   BMI 36.78 kg/m  BP Readings from Last 3 Encounters:  09/25/22 105/70  07/26/22 105/65  05/10/22 112/69   Wt Readings from Last 3 Encounters:  09/25/22 221 lb (100.2 kg)  07/26/22 225 lb (102.1 kg)  05/10/22 225 lb (102.1 kg)      Physical Exam     BP 105/70   Pulse 69   Ht 5\' 5"  (1.651 m)   Wt 221 lb (100.2 kg)   SpO2 99%   BMI 36.78 kg/m   General Appearance:     Alert, cooperative,obese no distress, appears stated age  Head:    Normocephalic, without obvious abnormality, atraumatic  Eyes:    PERRL, conjunctiva/corneas clear, EOM's intact, fundi    benign, both eyes  Ears:    Normal TM's and external ear canals, both ears  Nose:   Nares normal, septum midline, mucosa normal, no drainage    or sinus tenderness  Throat:   Lips, mucosa, and tongue normal; teeth and gums normal  Neck:   Supple, symmetrical, trachea midline, no adenopathy;    thyroid:  no enlargement/tenderness/nodules; no carotid   bruit or JVD  Back:     Symmetric, no curvature, ROM normal, no CVA tenderness  Lungs:     Clear to auscultation bilaterally, respirations unlabored  Chest Wall:    No tenderness or deformity   Heart:    Regular rate and rhythm, S1 and S2 normal, no murmur, rub   or gallop     Abdomen:     Soft, non-tender, bowel sounds active all four quadrants,    no masses, no organomegaly        Extremities:   Extremities normal, atraumatic, no cyanosis or edema  Pulses:   2+ and symmetric all extremities  Skin:   Skin color, texture, turgor normal, no rashes or lesions  Lymph nodes:   Cervical, supraclavicular, and axillary nodes normal  Neurologic:   CNII-XII intact, normal strength, sensation and reflexes    throughout   Assessment & Plan:    Routine Health Maintenance and Physical Exam  Immunization History  Administered Date(s) Administered   Influenza Inj Mdck Quad Pf 12/14/2019   Influenza Split 11/09/2016   Influenza,inj,Quad PF,6+ Mos 11/12/2020   Influenza,inj,quad, With Preservative 12/14/2019   Influenza-Unspecified 11/10/2015, 11/09/2016, 11/22/2018   Tdap 10/31/2019    Health Maintenance  Topic Date Due   OPHTHALMOLOGY EXAM  Never done   PAP SMEAR-Modifier  06/18/2022   Diabetic kidney evaluation - Urine ACR  10/13/2022   COVID-19 Vaccine (1 - 2023-24 season) 11/17/2022 (Originally 09/30/2021)   INFLUENZA VACCINE  04/30/2023 (Originally  08/31/2022)   Hepatitis C Screening  05/10/2023 (Originally 07/07/2005)   Diabetic kidney evaluation - eGFR measurement  11/17/2022   HEMOGLOBIN A1C  01/25/2023   FOOT EXAM  04/26/2023   DTaP/Tdap/Td (2 - Td or Tdap) 10/30/2029   HIV Screening  Completed   HPV VACCINES  Aged Out    Discussed health benefits of physical activity, and encouraged her to engage in regular exercise appropriate for her age and condition.  Marland KitchenShanda Bumps was  seen today for annual exam.  Diagnoses and all orders for this visit:  Routine physical examination -     CMP14+EGFR -     CBC w/Diff/Platelet -     Lipid panel -     TSH -     Urine Microalbumin w/creat. ratio  Dyslipidemia, goal LDL below 70 -     Lipid panel  Medication management -     CMP14+EGFR -     CBC w/Diff/Platelet -     Lipid panel -     TSH -     Urine Microalbumin w/creat. ratio  Type 2 diabetes mellitus with other specified complication, without long-term current use of insulin (HCC) -     CMP14+EGFR -     CBC w/Diff/Platelet -     Lipid panel -     TSH -     Urine Microalbumin w/creat. ratio  Class 2 severe obesity due to excess calories with serious comorbidity and body mass index (BMI) of 36.0 to 36.9 in adult [facility])   .Marland Kitchen Discussed 150 minutes of exercise a week.  Encouraged vitamin D 1000 units and Calcium 1300mg  or 4 servings of dairy a day.  Fasting labs ordered PHQ no concerns Vitals look great Needs eye exam Schedule pap smear Declined covid vaccine Will get flu shot at work Keep DM follow ups    Return in about 1 year (around 09/25/2023).     Tandy Gaw, PA-C

## 2022-09-26 ENCOUNTER — Other Ambulatory Visit (HOSPITAL_BASED_OUTPATIENT_CLINIC_OR_DEPARTMENT_OTHER): Payer: Self-pay

## 2022-09-26 ENCOUNTER — Encounter: Payer: Self-pay | Admitting: Physician Assistant

## 2022-09-26 LAB — CBC WITH DIFFERENTIAL/PLATELET
Basophils Absolute: 0 10*3/uL (ref 0.0–0.2)
Basos: 0 %
EOS (ABSOLUTE): 0.3 10*3/uL (ref 0.0–0.4)
Eos: 4 %
Hematocrit: 41.7 % (ref 34.0–46.6)
Hemoglobin: 14 g/dL (ref 11.1–15.9)
Immature Grans (Abs): 0 10*3/uL (ref 0.0–0.1)
Immature Granulocytes: 0 %
Lymphocytes Absolute: 2.4 10*3/uL (ref 0.7–3.1)
Lymphs: 34 %
MCH: 29.9 pg (ref 26.6–33.0)
MCHC: 33.6 g/dL (ref 31.5–35.7)
MCV: 89 fL (ref 79–97)
Monocytes Absolute: 0.4 10*3/uL (ref 0.1–0.9)
Monocytes: 6 %
Neutrophils Absolute: 3.8 10*3/uL (ref 1.4–7.0)
Neutrophils: 56 %
Platelets: 268 10*3/uL (ref 150–450)
RBC: 4.69 x10E6/uL (ref 3.77–5.28)
RDW: 12.8 % (ref 11.7–15.4)
WBC: 6.9 10*3/uL (ref 3.4–10.8)

## 2022-09-26 LAB — CMP14+EGFR
ALT: 12 IU/L (ref 0–32)
AST: 15 IU/L (ref 0–40)
Albumin: 4.6 g/dL (ref 3.9–4.9)
Alkaline Phosphatase: 76 IU/L (ref 44–121)
BUN/Creatinine Ratio: 13 (ref 9–23)
BUN: 10 mg/dL (ref 6–20)
Bilirubin Total: 0.6 mg/dL (ref 0.0–1.2)
CO2: 23 mmol/L (ref 20–29)
Calcium: 9.7 mg/dL (ref 8.7–10.2)
Chloride: 102 mmol/L (ref 96–106)
Creatinine, Ser: 0.8 mg/dL (ref 0.57–1.00)
Globulin, Total: 2.7 g/dL (ref 1.5–4.5)
Glucose: 72 mg/dL (ref 70–99)
Potassium: 4.1 mmol/L (ref 3.5–5.2)
Sodium: 139 mmol/L (ref 134–144)
Total Protein: 7.3 g/dL (ref 6.0–8.5)
eGFR: 98 mL/min/{1.73_m2} (ref >59)

## 2022-09-26 LAB — LIPID PANEL
Chol/HDL Ratio: 3.8 ratio (ref 0.0–4.4)
Cholesterol, Total: 178 mg/dL (ref 100–199)
HDL: 47 mg/dL (ref >39)
LDL Chol Calc (NIH): 116 mg/dL — ABNORMAL HIGH (ref 0–99)
Triglycerides: 82 mg/dL (ref 0–149)
VLDL Cholesterol Cal: 15 mg/dL (ref 5–40)

## 2022-09-26 LAB — MICROALBUMIN / CREATININE URINE RATIO
Creatinine, Urine: 111 mg/dL
Microalb/Creat Ratio: 4 mg/g creat (ref 0–29)
Microalbumin, Urine: 4.6 ug/mL

## 2022-09-26 LAB — TSH: TSH: 1.61 u[IU]/mL (ref 0.450–4.500)

## 2022-09-26 MED ORDER — ATORVASTATIN CALCIUM 20 MG PO TABS
20.0000 mg | ORAL_TABLET | Freq: Every day | ORAL | 3 refills | Status: DC
  Filled 2022-09-26 – 2022-10-18 (×2): qty 90, 90d supply, fill #0

## 2022-09-26 NOTE — Progress Notes (Signed)
Courtney Bailey,   Thyroid looks great.  Kidney and liver look wonderful.  Hemoglobin looks good.  Your LDL, bad cholesterol, is not to goal of under 70. Are you ok with increasing lipitor to 20mg ?

## 2022-10-05 ENCOUNTER — Other Ambulatory Visit (HOSPITAL_BASED_OUTPATIENT_CLINIC_OR_DEPARTMENT_OTHER): Payer: Self-pay

## 2022-10-05 DIAGNOSIS — F4323 Adjustment disorder with mixed anxiety and depressed mood: Secondary | ICD-10-CM | POA: Diagnosis not present

## 2022-10-12 ENCOUNTER — Other Ambulatory Visit (HOSPITAL_COMMUNITY): Payer: Self-pay

## 2022-10-12 ENCOUNTER — Other Ambulatory Visit (HOSPITAL_BASED_OUTPATIENT_CLINIC_OR_DEPARTMENT_OTHER): Payer: Self-pay

## 2022-10-18 ENCOUNTER — Other Ambulatory Visit (HOSPITAL_BASED_OUTPATIENT_CLINIC_OR_DEPARTMENT_OTHER): Payer: Self-pay

## 2022-10-19 ENCOUNTER — Other Ambulatory Visit (HOSPITAL_BASED_OUTPATIENT_CLINIC_OR_DEPARTMENT_OTHER): Payer: Self-pay

## 2022-10-21 ENCOUNTER — Encounter: Payer: Self-pay | Admitting: Physician Assistant

## 2022-10-24 ENCOUNTER — Other Ambulatory Visit (HOSPITAL_BASED_OUTPATIENT_CLINIC_OR_DEPARTMENT_OTHER): Payer: Self-pay

## 2022-10-24 ENCOUNTER — Telehealth (INDEPENDENT_AMBULATORY_CARE_PROVIDER_SITE_OTHER): Payer: 59 | Admitting: Physician Assistant

## 2022-10-24 ENCOUNTER — Encounter: Payer: Self-pay | Admitting: Physician Assistant

## 2022-10-24 VITALS — Ht 67.0 in | Wt 215.0 lb

## 2022-10-24 DIAGNOSIS — G43109 Migraine with aura, not intractable, without status migrainosus: Secondary | ICD-10-CM | POA: Insufficient documentation

## 2022-10-24 DIAGNOSIS — R4189 Other symptoms and signs involving cognitive functions and awareness: Secondary | ICD-10-CM

## 2022-10-24 DIAGNOSIS — R413 Other amnesia: Secondary | ICD-10-CM | POA: Diagnosis not present

## 2022-10-24 DIAGNOSIS — M545 Low back pain, unspecified: Secondary | ICD-10-CM

## 2022-10-24 DIAGNOSIS — Z8489 Family history of other specified conditions: Secondary | ICD-10-CM | POA: Diagnosis not present

## 2022-10-24 MED ORDER — METHOCARBAMOL 500 MG PO TABS
500.0000 mg | ORAL_TABLET | Freq: Three times a day (TID) | ORAL | 0 refills | Status: DC | PRN
Start: 2022-10-24 — End: 2023-09-26
  Filled 2022-10-24: qty 90, 30d supply, fill #0

## 2022-10-24 NOTE — Progress Notes (Unsigned)
..Virtual Visit via Video Note  I connected with Courtney Bailey on 10/25/22 at  1:00 PM EDT by a video enabled telemedicine application and verified that I am speaking with the correct person using two identifiers.  Location: Patient: home Provider: clinic  .Marland KitchenParticipating in visit:  Patient: Courtney Bailey Provider: Tandy Gaw PA-C   I discussed the limitations of evaluation and management by telemedicine and the availability of in person appointments. The patient expressed understanding and agreed to proceed.  History of Present Illness: Patient is a 37 year old female who is a type II diabetic who presents to the clinic via video to discuss some brain fog and memory changes that concern her.  She feels like her brain fog has been going on for some time.  She got really worried a few days ago when she did not recognize a old friend at a store.  It took her forever to realize who she was.  She does have a history of migraines with aura.  That has been relatively controlled with ibuprofen as needed.  She denies any vision changes, loss of bladder control, gait changes, upper or lower strength changes.  Her father did pass away from a glioblastoma brain tumor.  This has her very concerned.  Patient does have a history of lumbar degenerative disc disease and tweaked her back this weekend.  She denies any radiation of pain down her legs.  She denies any saddle anesthesia.  She requests some muscle relaxer to help with low back tightness.   .. Active Ambulatory Problems    Diagnosis Date Noted   Class 2 severe obesity due to excess calories with serious comorbidity and body mass index (BMI) of 36.0 to 36.9 in adult [facility]) 12/17/2017   Seborrheic dermatitis of scalp 12/17/2017   Itchy scalp 12/17/2017   Eyelid dermatitis, allergic/contact, right 12/17/2017   Elevated fasting glucose 12/18/2017   Dyslipidemia, goal LDL below 70 12/18/2017   Perceived hearing changes 01/04/2018   ETD (Eustachian  tube dysfunction), bilateral 01/04/2018   Depression, recurrent (HCC) 02/19/2018   Anxiety 02/19/2018   Insomnia 02/19/2018   Seasonal allergies 04/30/2018   Seasonal allergic rhinitis due to pollen 04/30/2018   Allergic conjunctivitis of both eyes 04/30/2018   Breast mass, right 11/22/2018   Frequent headaches 02/21/2019   Generalized anxiety disorder 02/27/2019   Inattention 03/05/2019   Left lumbar radiculitis 04/01/2019   Family history of blood clots 04/18/2019   Pain of left calf 04/18/2019   Abnormal glucose in pregnancy, antepartum 07/22/2019   Urinary frequency 09/19/2019   Iron deficiency anemia secondary to inadequate dietary iron intake 09/19/2019   Complicated grief 05/07/2020   Post partum depression 05/07/2020   No energy 09/27/2020   Diabetes mellitus (HCC) 09/29/2020   Dry skin 12/29/2020   Elevated liver enzymes 12/29/2020   Panic attacks 10/12/2021   Nausea vomiting and diarrhea 07/26/2022   Acute right-sided low back pain without sciatica 10/24/2022   Family history of brain tumor 10/24/2022   Migraine with aura and without status migrainosus, not intractable 10/24/2022   Brain fog 10/25/2022   Memory changes 10/25/2022   Resolved Ambulatory Problems    Diagnosis Date Noted   Pre-diabetes 12/21/2017   Spotting between menses 05/14/2018   [redacted] weeks gestation of pregnancy 09/19/2019   Past Medical History:  Diagnosis Date   Disc degeneration, lumbar    Sleep difficulties    Weight gain     Observations/Objective: No acute distress Normal mood and appearance Pt is tearful  and worried about her memory   Assessment and Plan: Marland KitchenMarland KitchenDiagnoses and all orders for this visit:  Migraine with aura and without status migrainosus, not intractable -     MR Brain W Wo Contrast; Future  Acute right-sided low back pain without sciatica -     methocarbamol (ROBAXIN) 500 MG tablet; Take 1 tablet (500 mg total) by mouth every 8 (eight) hours as needed for muscle  spasms.  Family history of brain tumor -     MR Brain W Wo Contrast; Future  Memory changes -     MR Brain W Wo Contrast; Future  Brain fog -     MR Brain W Wo Contrast; Future   Pt is very oriented today Pt is concerned with brain fog and memory changes with migraine hx and her father brain tumor dx Will get MRI Continue to use ibuprofen and or tylenol Robaxin sent for low back pain and muscle tightness Use icy hot and heating pad as well Discussed low back stretches that can also help Her statin was just increased stop if for next month and see if memory and brain fog gets better Follow up in 1 month   Follow Up Instructions:    I discussed the assessment and treatment plan with the patient. The patient was provided an opportunity to ask questions and all were answered. The patient agreed with the plan and demonstrated an understanding of the instructions.   The patient was advised to call back or seek an in-person evaluation if the symptoms worsen or if the condition fails to improve as anticipated.   Tandy Gaw, PA-C
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
## 2022-10-25 ENCOUNTER — Ambulatory Visit: Payer: 59 | Admitting: Physician Assistant

## 2022-10-25 DIAGNOSIS — R4189 Other symptoms and signs involving cognitive functions and awareness: Secondary | ICD-10-CM | POA: Insufficient documentation

## 2022-10-25 DIAGNOSIS — R413 Other amnesia: Secondary | ICD-10-CM | POA: Insufficient documentation

## 2022-10-26 DIAGNOSIS — F4323 Adjustment disorder with mixed anxiety and depressed mood: Secondary | ICD-10-CM | POA: Diagnosis not present

## 2022-10-31 ENCOUNTER — Ambulatory Visit (INDEPENDENT_AMBULATORY_CARE_PROVIDER_SITE_OTHER): Payer: 59 | Admitting: Physician Assistant

## 2022-10-31 ENCOUNTER — Ambulatory Visit: Payer: 59

## 2022-10-31 ENCOUNTER — Encounter: Payer: Self-pay | Admitting: Physician Assistant

## 2022-10-31 ENCOUNTER — Other Ambulatory Visit (HOSPITAL_BASED_OUTPATIENT_CLINIC_OR_DEPARTMENT_OTHER): Payer: Self-pay

## 2022-10-31 VITALS — BP 112/70 | HR 68 | Ht 67.0 in | Wt 221.0 lb

## 2022-10-31 DIAGNOSIS — Z23 Encounter for immunization: Secondary | ICD-10-CM | POA: Diagnosis not present

## 2022-10-31 DIAGNOSIS — E6609 Other obesity due to excess calories: Secondary | ICD-10-CM | POA: Diagnosis not present

## 2022-10-31 DIAGNOSIS — Z8489 Family history of other specified conditions: Secondary | ICD-10-CM

## 2022-10-31 DIAGNOSIS — G43109 Migraine with aura, not intractable, without status migrainosus: Secondary | ICD-10-CM

## 2022-10-31 DIAGNOSIS — E66811 Obesity, class 1: Secondary | ICD-10-CM | POA: Diagnosis not present

## 2022-10-31 DIAGNOSIS — R4182 Altered mental status, unspecified: Secondary | ICD-10-CM | POA: Diagnosis not present

## 2022-10-31 DIAGNOSIS — E1169 Type 2 diabetes mellitus with other specified complication: Secondary | ICD-10-CM | POA: Diagnosis not present

## 2022-10-31 DIAGNOSIS — Z6834 Body mass index (BMI) 34.0-34.9, adult: Secondary | ICD-10-CM | POA: Diagnosis not present

## 2022-10-31 DIAGNOSIS — R4189 Other symptoms and signs involving cognitive functions and awareness: Secondary | ICD-10-CM

## 2022-10-31 DIAGNOSIS — E785 Hyperlipidemia, unspecified: Secondary | ICD-10-CM | POA: Diagnosis not present

## 2022-10-31 DIAGNOSIS — Z124 Encounter for screening for malignant neoplasm of cervix: Secondary | ICD-10-CM

## 2022-10-31 DIAGNOSIS — Z808 Family history of malignant neoplasm of other organs or systems: Secondary | ICD-10-CM | POA: Diagnosis not present

## 2022-10-31 DIAGNOSIS — R413 Other amnesia: Secondary | ICD-10-CM | POA: Diagnosis not present

## 2022-10-31 LAB — POCT GLYCOSYLATED HEMOGLOBIN (HGB A1C): Hemoglobin A1C: 5.6 % (ref 4.0–5.6)

## 2022-10-31 MED ORDER — GADOBUTROL 1 MMOL/ML IV SOLN
10.0000 mL | Freq: Once | INTRAVENOUS | Status: AC | PRN
  Administered 2022-10-31: 10 mL via INTRAVENOUS

## 2022-10-31 MED ORDER — TIRZEPATIDE 7.5 MG/0.5ML ~~LOC~~ SOAJ
7.5000 mg | SUBCUTANEOUS | 0 refills | Status: DC
Start: 2022-10-31 — End: 2023-02-13
  Filled 2022-10-31: qty 6, 84d supply, fill #0
  Filled 2022-11-06: qty 2, 28d supply, fill #0
  Filled 2022-12-05: qty 2, 28d supply, fill #1
  Filled 2023-01-01: qty 2, 28d supply, fill #2

## 2022-10-31 NOTE — Progress Notes (Unsigned)
Established Patient Office Visit  Subjective   Patient ID: Courtney Bailey, female    DOB: 06/12/1985  Age: 37 y.o. MRN: [id number]  Chief Complaint  Patient presents with   Medical Management of Chronic Issues    HPI Pt is a 37 yo female with T2DM, HLD who presents to the clinic for 3 month follow up.   Pt is doing well. She is not checking her sugars. She is compliant with medications. She is exercising daily. She has good energy and sleep. No concerns with medication. Denies any CP, palpitations, headaches or vision changes.   She did have MRI of head done today for memory issues and brain fog. Her father had a brain tumor and has past away. She is concerned about this.   .. Active Ambulatory Problems    Diagnosis Date Noted   Class 2 severe obesity due to excess calories with serious comorbidity and body mass index (BMI) of 36.0 to 36.9 in adult (HCC) 12/17/2017   Seborrheic dermatitis of scalp 12/17/2017   Itchy scalp 12/17/2017   Eyelid dermatitis, allergic/contact, right 12/17/2017   Elevated fasting glucose 12/18/2017   Dyslipidemia, goal LDL below 70 12/18/2017   Subjective hearing change 01/04/2018   ETD (Eustachian tube dysfunction), bilateral 01/04/2018   Depression, recurrent (HCC) 02/19/2018   Anxiety 02/19/2018   Insomnia 02/19/2018   Seasonal allergies 04/30/2018   Seasonal allergic rhinitis due to pollen 04/30/2018   Allergic conjunctivitis of both eyes 04/30/2018   Breast mass, right 11/22/2018   Frequent headaches 02/21/2019   Generalized anxiety disorder 02/27/2019   Inattention 03/05/2019   Left lumbar radiculitis 04/01/2019   Family history of blood clots 04/18/2019   Pain of left calf 04/18/2019   Abnormal glucose in pregnancy, antepartum 07/22/2019   Urinary frequency 09/19/2019   Iron deficiency anemia secondary to inadequate dietary iron intake 09/19/2019   Complicated grief 05/07/2020   Post partum depression 05/07/2020   No energy 09/27/2020    Diabetes mellitus (HCC) 09/29/2020   Dry skin 12/29/2020   Elevated liver enzymes 12/29/2020   Panic attacks 10/12/2021   Nausea vomiting and diarrhea 07/26/2022   Acute right-sided low back pain without sciatica 10/24/2022   Family history of brain tumor 10/24/2022   Migraine with aura and without status migrainosus, not intractable 10/24/2022   Brain fog 10/25/2022   Memory changes 10/25/2022   Resolved Ambulatory Problems    Diagnosis Date Noted   Pre-diabetes 12/21/2017   Spotting between menses 05/14/2018   [redacted] weeks gestation of pregnancy 09/19/2019   Past Medical History:  Diagnosis Date   Disc degeneration, lumbar    Sleep difficulties    Weight gain       ROS See HPI.    Objective:     BP 112/70   Pulse 68   Ht 5\' 7"  (1.702 m)   Wt 221 lb (100.2 kg)   SpO2 99%   BMI 34.61 kg/m  BP Readings from Last 3 Encounters:  10/31/22 112/70  09/25/22 105/70  07/26/22 105/65   Wt Readings from Last 3 Encounters:  10/31/22 221 lb (100.2 kg)  10/24/22 215 lb (97.5 kg)  09/25/22 221 lb (100.2 kg)    .Marland Kitchen Results for orders placed or performed in visit on 10/31/22  POCT HgB A1C  Result Value Ref Range   Hemoglobin A1C 5.6 4.0 - 5.6 %   HbA1c POC (<> result, manual entry)     HbA1c, POC (prediabetic range)     HbA1c,  POC (controlled diabetic range)       Physical Exam Constitutional:      Appearance: Normal appearance. She is obese.  HENT:     Head: Normocephalic.  Cardiovascular:     Rate and Rhythm: Regular rhythm. Tachycardia present.  Pulmonary:     Effort: Pulmonary effort is normal.     Breath sounds: Normal breath sounds.  Musculoskeletal:     Cervical back: No tenderness.     Right lower leg: No edema.     Left lower leg: No edema.  Lymphadenopathy:     Cervical: No cervical adenopathy.  Neurological:     General: No focal deficit present.     Mental Status: She is alert and oriented to person, place, and time.  Psychiatric:        Mood  and Affect: Mood normal.        Assessment & Plan:  .Marland KitchenEverlean was seen today for medical management of chronic issues.  Diagnoses and all orders for this visit:  Type 2 diabetes mellitus with other specified complication, without long-term current use of insulin (HCC) -     POCT HgB A1C -     tirzepatide (MOUNJARO) 7.5 MG/0.5ML Pen; Inject 7.5 mg into the skin once a week.  Cervical cancer screening -     Ambulatory referral to Obstetrics / Gynecology  Immunization due -     Flu vaccine trivalent PF, 6mos and older(Flulaval,Afluria,Fluarix,Fluzone)  Class 1 obesity due to excess calories with serious comorbidity and body mass index (BMI) of 34.0 to 34.9 in adult -     tirzepatide (MOUNJARO) 7.5 MG/0.5ML Pen; Inject 7.5 mg into the skin once a week.   A1C looks great Stay on mounjaro 7.5 for now Continue to work on weight loss with diet and excericse Not on statin right now due to trying to see if any of her brain fog is due to this BP to goal Needs eye exam Flu shot given today Follow up in 3 months  Referral to GYN for pap smear.  Return in about 3 months (around 01/31/2023).    Tandy Gaw, PA-C

## 2022-11-01 ENCOUNTER — Encounter: Payer: Self-pay | Admitting: Physician Assistant

## 2022-11-02 DIAGNOSIS — F4323 Adjustment disorder with mixed anxiety and depressed mood: Secondary | ICD-10-CM | POA: Diagnosis not present

## 2022-11-02 NOTE — Progress Notes (Signed)
Normal MRI! GREAT news.

## 2022-11-06 ENCOUNTER — Other Ambulatory Visit (HOSPITAL_BASED_OUTPATIENT_CLINIC_OR_DEPARTMENT_OTHER): Payer: Self-pay

## 2022-11-08 ENCOUNTER — Encounter: Payer: Self-pay | Admitting: Physician Assistant

## 2022-11-09 DIAGNOSIS — F4323 Adjustment disorder with mixed anxiety and depressed mood: Secondary | ICD-10-CM | POA: Diagnosis not present

## 2022-11-23 DIAGNOSIS — F4323 Adjustment disorder with mixed anxiety and depressed mood: Secondary | ICD-10-CM | POA: Diagnosis not present

## 2022-12-08 DIAGNOSIS — F4323 Adjustment disorder with mixed anxiety and depressed mood: Secondary | ICD-10-CM | POA: Diagnosis not present

## 2022-12-14 DIAGNOSIS — F4323 Adjustment disorder with mixed anxiety and depressed mood: Secondary | ICD-10-CM | POA: Diagnosis not present

## 2023-02-07 ENCOUNTER — Ambulatory Visit: Payer: 59 | Admitting: Physician Assistant

## 2023-02-13 ENCOUNTER — Other Ambulatory Visit (HOSPITAL_BASED_OUTPATIENT_CLINIC_OR_DEPARTMENT_OTHER): Payer: Self-pay

## 2023-02-13 ENCOUNTER — Encounter: Payer: Self-pay | Admitting: Physician Assistant

## 2023-02-13 ENCOUNTER — Ambulatory Visit (INDEPENDENT_AMBULATORY_CARE_PROVIDER_SITE_OTHER): Payer: 59 | Admitting: Physician Assistant

## 2023-02-13 VITALS — BP 127/71 | HR 82 | Ht 67.0 in | Wt 221.0 lb

## 2023-02-13 DIAGNOSIS — Z7985 Long-term (current) use of injectable non-insulin antidiabetic drugs: Secondary | ICD-10-CM | POA: Diagnosis not present

## 2023-02-13 DIAGNOSIS — E66811 Obesity, class 1: Secondary | ICD-10-CM

## 2023-02-13 DIAGNOSIS — Z6834 Body mass index (BMI) 34.0-34.9, adult: Secondary | ICD-10-CM | POA: Diagnosis not present

## 2023-02-13 DIAGNOSIS — F411 Generalized anxiety disorder: Secondary | ICD-10-CM | POA: Diagnosis not present

## 2023-02-13 DIAGNOSIS — E6609 Other obesity due to excess calories: Secondary | ICD-10-CM | POA: Diagnosis not present

## 2023-02-13 DIAGNOSIS — E1169 Type 2 diabetes mellitus with other specified complication: Secondary | ICD-10-CM | POA: Diagnosis not present

## 2023-02-13 DIAGNOSIS — E785 Hyperlipidemia, unspecified: Secondary | ICD-10-CM | POA: Diagnosis not present

## 2023-02-13 LAB — POCT GLYCOSYLATED HEMOGLOBIN (HGB A1C): Hemoglobin A1C: 5.5 % (ref 4.0–5.6)

## 2023-02-13 MED ORDER — CITALOPRAM HYDROBROMIDE 10 MG PO TABS
10.0000 mg | ORAL_TABLET | Freq: Every day | ORAL | 0 refills | Status: DC
  Filled 2023-02-13: qty 90, 90d supply, fill #0

## 2023-02-13 MED ORDER — TIRZEPATIDE 5 MG/0.5ML ~~LOC~~ SOAJ
5.0000 mg | SUBCUTANEOUS | 0 refills | Status: DC
  Filled 2023-02-13: qty 2, 28d supply, fill #0
  Filled 2023-03-10: qty 2, 28d supply, fill #1
  Filled 2023-04-09: qty 2, 28d supply, fill #2

## 2023-02-13 NOTE — Patient Instructions (Addendum)
 Restart celexa Stay at [facility] 5mg  weekly Goal exercise 150 minutes a week

## 2023-02-13 NOTE — Progress Notes (Signed)
 Established Patient Office Visit  Subjective   Patient ID: Carolann A Edmonds, female    DOB: [birth year]  Age: 38 y.o. MRN: [id number]  Chief Complaint  Patient presents with   Medical Management of Chronic Issues    Last A1c 5.6    HPI Pt is a 38 yo obese female with T2DM, HTN, HLD who presents to the clinic for follow up.   She is not checking her sugars. She has been on mounjaro  but she did not like the last increase and felt it was causing mood changes, more nausea and just did not feel good. She has not taken a shot in 2 weeks and feeling better. She wonders if she should come off mounjaro  all together and start metformin . She is trying to stay active and just bought a walking pad. She denies any vision, CP, palpitations, headaches. She is not taking her lipitor right now because felt like it was causing brain fog.   She has been more anxious and irritable. She was on celexa  and wants to restart.   .. Active Ambulatory Problems    Diagnosis Date Noted   Class 2 severe obesity due to excess calories with serious comorbidity and body mass index (BMI) of 36.0 to 36.9 in adult [facility]) 12/17/2017   Seborrheic dermatitis of scalp 12/17/2017   Itchy scalp 12/17/2017   Eyelid dermatitis, allergic/contact, right 12/17/2017   Elevated fasting glucose 12/18/2017   Dyslipidemia, goal LDL below 70 12/18/2017   Subjective hearing change 01/04/2018   ETD (Eustachian tube dysfunction), bilateral 01/04/2018   Depression, recurrent (HCC) 02/19/2018   Anxiety 02/19/2018   Insomnia 02/19/2018   Seasonal allergies 04/30/2018   Seasonal allergic rhinitis due to pollen 04/30/2018   Allergic conjunctivitis of both eyes 04/30/2018   Breast mass, right 11/22/2018   Frequent headaches 02/21/2019   Generalized anxiety disorder 02/27/2019   Inattention 03/05/2019   Left lumbar radiculitis 04/01/2019   Family history of blood clots 04/18/2019   Pain of left calf 04/18/2019   Abnormal glucose in  pregnancy, antepartum 07/22/2019   Urinary frequency 09/19/2019   Iron deficiency anemia secondary to inadequate dietary iron intake 09/19/2019   Complicated grief 05/07/2020   Post partum depression 05/07/2020   No energy 09/27/2020   Diabetes mellitus (HCC) 09/29/2020   Dry skin 12/29/2020   Elevated liver enzymes 12/29/2020   Panic attacks 10/12/2021   Nausea vomiting and diarrhea 07/26/2022   Acute right-sided low back pain without sciatica 10/24/2022   Family history of brain tumor 10/24/2022   Migraine with aura and without status migrainosus, not intractable 10/24/2022   Brain fog 10/25/2022   Memory changes 10/25/2022   Class 1 obesity due to excess calories with serious comorbidity and body mass index (BMI) of 34.0 to 34.9 in adult 02/13/2023   Resolved Ambulatory Problems    Diagnosis Date Noted   Pre-diabetes 12/21/2017   Spotting between menses 05/14/2018   [redacted] weeks gestation of pregnancy 09/19/2019   Past Medical History:  Diagnosis Date   Disc degeneration, lumbar    Sleep difficulties    Weight gain      Review of Systems  All other systems reviewed and are negative.     Objective:     BP 127/71   Pulse 82   Ht 5' 7 (1.702 m)   Wt 221 lb (100.2 kg)   SpO2 99%   BMI 34.61 kg/m  BP Readings from Last 3 Encounters:  02/13/23 127/71  10/31/22 112/70  09/25/22 105/70   Wt Readings from Last 3 Encounters:  02/13/23 221 lb (100.2 kg)  10/31/22 221 lb (100.2 kg)  10/24/22 215 lb (97.5 kg)    ..    02/13/2023    8:37 AM 09/25/2022    2:15 PM 07/26/2022    8:06 AM 01/11/2022    8:46 AM 09/24/2020    9:06 AM  Depression screen PHQ 2/9  Decreased Interest 2 0 0 0 1  Down, Depressed, Hopeless 0 0 0 0 0  PHQ - 2 Score 2 0 0 0 1  Altered sleeping 1    0  Tired, decreased energy 1    1  Change in appetite 1    1  Feeling bad or failure about yourself  0    0  Trouble concentrating 0    1  Moving slowly or fidgety/restless 0    0  Suicidal  thoughts 0    0  PHQ-9 Score 5    4  Difficult doing work/chores Not difficult at all    Not difficult at all   .SABRA    02/13/2023    8:37 AM 09/24/2020    9:07 AM 05/05/2020    8:36 AM 09/25/2018    8:49 AM  GAD 7 : Generalized Anxiety Score  Nervous, Anxious, on Edge 1 0 1 0  Control/stop worrying 1 0 1 0  Worry too much - different things 1 1 1  0  Trouble relaxing 1 0 0 1  Restless 1 0 0 0  Easily annoyed or irritable 1 0 0 0  Afraid - awful might happen 1 0 1 0  Total GAD 7 Score 7 1 4 1   Anxiety Difficulty Not difficult at all Not difficult at all Somewhat difficult Somewhat difficult      Physical Exam Constitutional:      Appearance: Normal appearance.  HENT:     Head: Normocephalic.  Cardiovascular:     Rate and Rhythm: Normal rate and regular rhythm.  Pulmonary:     Effort: Pulmonary effort is normal.     Breath sounds: Normal breath sounds.  Neurological:     Mental Status: She is alert and oriented to person, place, and time.  Psychiatric:        Mood and Affect: Mood normal.      Results for orders placed or performed in visit on 02/13/23  POCT HgB A1C  Result Value Ref Range   Hemoglobin A1C 5.5 4.0 - 5.6 %   HbA1c POC (<> result, manual entry)     HbA1c, POC (prediabetic range)     HbA1c, POC (controlled diabetic range)         Assessment & Plan:  .SABRADondra was seen today for medical management of chronic issues.  Diagnoses and all orders for this visit:  Type 2 diabetes mellitus with other specified complication, without long-term current use of insulin  (HCC) -     tirzepatide  (MOUNJARO ) 5 MG/0.5ML Pen; Inject 5 mg into the skin once a week. -     POCT HgB A1C  Dyslipidemia, goal LDL below 70  Generalized anxiety disorder -     citalopram  (CELEXA ) 10 MG tablet; Take 1 tablet (10 mg total) by mouth daily.  Class 1 obesity due to excess calories with serious comorbidity and body mass index (BMI) of 34.0 to 34.9 in adult   A1C to  goal Decided to decreased mounjaro  to 5mg  to avoid SE but still give her cardiovascular protection, help  with weight management, and glucose control. BP to goal Not taking statin encouraged to start back if even 1/2 dose and see if brain fog resumed then we could discuss trial of another medication Will check LDL at next visit Eye exam scheduled Foot exam UTD Declined pnuemonia and covid vaccine Flu UTD Follow up in 3 months  Schedule pap   Discussed mood PHQ and GAD not to goal Added celexa  back Discussed regular exercise and good sleep Will continue to monitor    Return in about 3 months (around 05/14/2023).    Larry Alcock, PA-C

## 2023-03-10 DIAGNOSIS — E119 Type 2 diabetes mellitus without complications: Secondary | ICD-10-CM | POA: Diagnosis not present

## 2023-03-10 DIAGNOSIS — H52223 Regular astigmatism, bilateral: Secondary | ICD-10-CM | POA: Diagnosis not present

## 2023-05-15 ENCOUNTER — Encounter: Payer: Self-pay | Admitting: Physician Assistant

## 2023-05-15 ENCOUNTER — Ambulatory Visit (INDEPENDENT_AMBULATORY_CARE_PROVIDER_SITE_OTHER): Payer: 59 | Admitting: Physician Assistant

## 2023-05-15 ENCOUNTER — Other Ambulatory Visit (HOSPITAL_BASED_OUTPATIENT_CLINIC_OR_DEPARTMENT_OTHER): Payer: Self-pay

## 2023-05-15 VITALS — BP 110/71 | HR 71 | Ht 67.0 in | Wt 229.0 lb

## 2023-05-15 DIAGNOSIS — Z7985 Long-term (current) use of injectable non-insulin antidiabetic drugs: Secondary | ICD-10-CM

## 2023-05-15 DIAGNOSIS — E1169 Type 2 diabetes mellitus with other specified complication: Secondary | ICD-10-CM

## 2023-05-15 DIAGNOSIS — Z6835 Body mass index (BMI) 35.0-35.9, adult: Secondary | ICD-10-CM | POA: Diagnosis not present

## 2023-05-15 DIAGNOSIS — E66812 Obesity, class 2: Secondary | ICD-10-CM

## 2023-05-15 DIAGNOSIS — F411 Generalized anxiety disorder: Secondary | ICD-10-CM | POA: Diagnosis not present

## 2023-05-15 LAB — POCT GLYCOSYLATED HEMOGLOBIN (HGB A1C): Hemoglobin A1C: 5.4 % (ref 4.0–5.6)

## 2023-05-15 MED ORDER — CITALOPRAM HYDROBROMIDE 20 MG PO TABS
20.0000 mg | ORAL_TABLET | Freq: Every day | ORAL | 3 refills | Status: AC
  Filled 2023-05-15: qty 90, 90d supply, fill #0
  Filled 2023-08-29: qty 90, 90d supply, fill #1
  Filled 2023-12-19: qty 90, 90d supply, fill #2

## 2023-05-15 MED ORDER — TIRZEPATIDE 7.5 MG/0.5ML ~~LOC~~ SOAJ
7.5000 mg | SUBCUTANEOUS | 0 refills | Status: DC
  Filled 2023-05-15: qty 2, 28d supply, fill #0
  Filled 2023-06-20 – 2023-06-21 (×2): qty 2, 28d supply, fill #1
  Filled 2023-07-18: qty 2, 28d supply, fill #2

## 2023-05-15 NOTE — Progress Notes (Unsigned)
 Established Patient Office Visit  Subjective   Patient ID: Courtney Bailey, female    DOB: January 26, 1986  Age: 38 y.o. MRN: [id number]  Chief Complaint  Patient presents with   Medical Management of Chronic Issues    Type 2 diabetes mellitus with other specified complication, without long-term current use of insulin (HCC)    HPI Pt is a 38 yo female with T2DM and GAD who presents to the clinic for 3 month follow up.   She is doing well. She does not check her sugars at home. She denies any hypoglycemic events. She denies any CP, palpitations, headaches or vision changes. She is compliant with mounjaro. She does feel like she is more hungry and not making the best diet decisions.   Her anxiety is high right now. She feels like some of it is her mother being out of the country. Overall she feels like lately she has just been more worried about things. She would like to increase celexa.     Review of Systems  All other systems reviewed and are negative.     Objective:     BP 110/71   Pulse 71   Ht 5\' 7"  (1.702 m)   Wt 229 lb (103.9 kg)   SpO2 99%   BMI 35.87 kg/m  BP Readings from Last 3 Encounters:  05/15/23 110/71  02/13/23 127/71  10/31/22 112/70   Wt Readings from Last 3 Encounters:  05/15/23 229 lb (103.9 kg)  02/13/23 221 lb (100.2 kg)  10/31/22 221 lb (100.2 kg)    .    05/15/2023    8:35 AM 02/13/2023    8:37 AM 09/25/2022    2:15 PM 07/26/2022    8:06 AM 01/11/2022    8:46 AM  Depression screen PHQ 2/9  Decreased Interest 0 2 0 0 0  Down, Depressed, Hopeless 0 0 0 0 0  PHQ - 2 Score 0 2 0 0 0  Altered sleeping  1     Tired, decreased energy  1     Change in appetite  1     Feeling bad or failure about yourself   0     Trouble concentrating  0     Moving slowly or fidgety/restless  0     Suicidal thoughts  0     PHQ-9 Score  5     Difficult doing work/chores  Not difficult at all      .Marland Kitchen    05/16/2023    6:52 AM 02/13/2023    8:37 AM 09/24/2020     9:07 AM 05/05/2020    8:36 AM  GAD 7 : Generalized Anxiety Score  Nervous, Anxious, on Edge 1 1 0 1  Control/stop worrying 1 1 0 1  Worry too much - different things 1 1 1 1   Trouble relaxing 1 1 0 0  Restless 1 1 0 0  Easily annoyed or irritable 1 1 0 0  Afraid - awful might happen 1 1 0 1  Total GAD 7 Score 7 7 1 4   Anxiety Difficulty Somewhat difficult Not difficult at all Not difficult at all Somewhat difficult      Physical Exam Constitutional:      Appearance: Normal appearance. She is obese.  Cardiovascular:     Rate and Rhythm: Normal rate and regular rhythm.  Pulmonary:     Effort: Pulmonary effort is normal.     Breath sounds: Normal breath sounds.  Neurological:  General: No focal deficit present.     Mental Status: She is alert and oriented to person, place, and time.  Psychiatric:        Mood and Affect: Mood normal.     .. Results for orders placed or performed in visit on 05/15/23  POCT HgB A1C   Collection Time: 05/15/23  8:32 AM  Result Value Ref Range   Hemoglobin A1C 5.4 4.0 - 5.6 %   HbA1c POC (<> result, manual entry)     HbA1c, POC (prediabetic range)     HbA1c, POC (controlled diabetic range)         Assessment & Plan:  .Courtney Bailey was seen today for medical management of chronic issues.  Diagnoses and all orders for this visit:  Type 2 diabetes mellitus with other specified complication, without long-term current use of insulin (HCC) -     POCT HgB A1C -     tirzepatide (MOUNJARO) 7.5 MG/0.5ML Pen; Inject 7.5 mg into the skin once a week.  Generalized anxiety disorder -     citalopram (CELEXA) 20 MG tablet; Take 1 tablet (20 mg total) by mouth daily.  Class 2 severe obesity due to excess calories with serious comorbidity and body mass index (BMI) of 35.0 to 35.9 in adult (HCC)   A1C to goal at 5.4 Increased mounjaro to help with weight loss Discussed DM diet and regular exercise BP to goal On statin Foot exam UTD Needs eye  exam Vaccines UTD Follow up in 3 months  GAD not to goal  Increased celexa to 20mg  daily Discussed coping skills with anxiety Follow up as needed or in 3 months   Return in about 3 months (around 08/14/2023).    Abid Bolla, PA-C

## 2023-05-16 ENCOUNTER — Encounter: Payer: Self-pay | Admitting: Physician Assistant

## 2023-06-20 ENCOUNTER — Other Ambulatory Visit (HOSPITAL_BASED_OUTPATIENT_CLINIC_OR_DEPARTMENT_OTHER): Payer: Self-pay

## 2023-06-21 ENCOUNTER — Other Ambulatory Visit (HOSPITAL_BASED_OUTPATIENT_CLINIC_OR_DEPARTMENT_OTHER): Payer: Self-pay

## 2023-06-21 ENCOUNTER — Other Ambulatory Visit (HOSPITAL_COMMUNITY): Payer: Self-pay

## 2023-07-18 ENCOUNTER — Other Ambulatory Visit (HOSPITAL_BASED_OUTPATIENT_CLINIC_OR_DEPARTMENT_OTHER): Payer: Self-pay

## 2023-08-14 ENCOUNTER — Ambulatory Visit (INDEPENDENT_AMBULATORY_CARE_PROVIDER_SITE_OTHER): Admitting: Physician Assistant

## 2023-08-14 ENCOUNTER — Other Ambulatory Visit (HOSPITAL_BASED_OUTPATIENT_CLINIC_OR_DEPARTMENT_OTHER): Payer: Self-pay

## 2023-08-14 VITALS — BP 128/78 | HR 78 | Temp 98.6°F | Ht 67.0 in | Wt 229.0 lb

## 2023-08-14 DIAGNOSIS — R5383 Other fatigue: Secondary | ICD-10-CM

## 2023-08-14 DIAGNOSIS — F411 Generalized anxiety disorder: Secondary | ICD-10-CM | POA: Diagnosis not present

## 2023-08-14 DIAGNOSIS — Z6835 Body mass index (BMI) 35.0-35.9, adult: Secondary | ICD-10-CM

## 2023-08-14 DIAGNOSIS — Z7985 Long-term (current) use of injectable non-insulin antidiabetic drugs: Secondary | ICD-10-CM | POA: Diagnosis not present

## 2023-08-14 DIAGNOSIS — E1169 Type 2 diabetes mellitus with other specified complication: Secondary | ICD-10-CM

## 2023-08-14 DIAGNOSIS — Z1159 Encounter for screening for other viral diseases: Secondary | ICD-10-CM

## 2023-08-14 DIAGNOSIS — E66812 Obesity, class 2: Secondary | ICD-10-CM | POA: Diagnosis not present

## 2023-08-14 DIAGNOSIS — E785 Hyperlipidemia, unspecified: Secondary | ICD-10-CM

## 2023-08-14 LAB — POCT GLYCOSYLATED HEMOGLOBIN (HGB A1C): Hemoglobin A1C: 5.4 % (ref 4.0–5.6)

## 2023-08-14 MED ORDER — TIRZEPATIDE 10 MG/0.5ML ~~LOC~~ SOAJ
10.0000 mg | SUBCUTANEOUS | 0 refills | Status: DC
  Filled 2023-08-14 – 2023-08-29 (×2): qty 2, 28d supply, fill #0

## 2023-08-14 NOTE — Progress Notes (Signed)
 Established Patient Office Visit  Subjective   Patient ID: Courtney Bailey, female    DOB: [birth year]  Age: 38 y.o. MRN: [id number]  Chief Complaint  Patient presents with   Medical Management of Chronic Issues    Last A1c  5.4  wants to go up in mounjaro    Patient is here for a 60-month follow up for her diabetes, weight loss medication, and anxiety. She does not check her blood sugars at home. Patient states she would like to go up on her mounjaro  because she feels like she has not lost any weight recently (patient does not weigh herself at home), and she feels like her appetite has increased recently, however she states she is on her menstrual period right now so she does not know if that is also having an influence on her appetite. Patient states her sleep has been poor because her daughter wakes her up from time to time.  She is curious if she is iron deficient because she had that in the past with her pregnancy and is wondering if that could be causing her fatigue. She is asking about B12 injections because her cousin said they might help with her energy. She says she has tolerated the celexa  dose increase, but feels like her low energy and low mood is most bothersome. She has no been exercising but states she would like to get back into it.    Review of Systems  Constitutional:  Positive for malaise/fatigue.  All other systems reviewed and are negative.     Objective:     BP 128/78   Pulse 78   Temp 98.6 F (37 C) (Oral)   Ht 5' 7 (1.702 m)   Wt 229 lb (103.9 kg)   SpO2 99%   BMI 35.87 kg/m  Wt Readings from Last 3 Encounters:  08/14/23 229 lb (103.9 kg)  05/15/23 229 lb (103.9 kg)  02/13/23 221 lb (100.2 kg)      Physical Exam Vitals reviewed.  Constitutional:      Appearance: Normal appearance.  HENT:     Head: Normocephalic and atraumatic.  Cardiovascular:     Rate and Rhythm: Regular rhythm.  Pulmonary:     Effort: Pulmonary effort is normal.   Musculoskeletal:        General: Normal range of motion.     Cervical back: Normal range of motion.  Skin:    General: Skin is warm and dry.  Neurological:     General: No focal deficit present.     Mental Status: She is alert and oriented to person, place, and time.      Results for orders placed or performed in visit on 08/14/23  POCT HgB A1C  Result Value Ref Range   Hemoglobin A1C 5.4 4.0 - 5.6 %   HbA1c POC (<> result, manual entry)     HbA1c, POC (prediabetic range)     HbA1c, POC (controlled diabetic range)       Assessment & Plan:   Diabetes Mellitus - A1C: 5.4, remains unchanged from last time  -continue with mounjaro  but increase to 10mg  weekly for weight loss benefits -will call and get eye exam faxed to office -BP to goal -on statin   Fatigue  - Ordered labs for TSH/T4, iron panel. B12/Folate, Vit D, CMP - Discussed possibility of getting a sleep study in the future, pt unsure if she snores at night - Educated patient on importance of trying to exercise 2 days a week,  discussed that a decrease in caloric intake can also cause fatigue   Weight loss medication  Class 2 Obesity  - Increase Mounjouro to 10mg  for 1 month - Have patient contact provider about response to medication, if she tolerates it, if she is noticing any weight loss - - Educated patient on importance of trying to exercise 2 days a week  Generalized Anxiety Disorder  - PHQ9: 9 - GAD7: 7 - Continue celexa  20mg   - Patient will reach out if she feels like she needs this dosage to be increased   Dyslipidemia  - Last LDL: 116 (09/25/2022) not to goal -on statin  Health Maintenance  - HepC antibody labs drawn    Return in about 3 months (around 11/14/2023).    Breeana Sawtelle, PA-C

## 2023-08-15 ENCOUNTER — Ambulatory Visit: Payer: Self-pay | Admitting: Physician Assistant

## 2023-08-15 LAB — HEPATITIS C ANTIBODY: Hep C Virus Ab: NONREACTIVE

## 2023-08-15 LAB — CMP14+EGFR
ALT: 11 IU/L (ref 0–32)
AST: 16 IU/L (ref 0–40)
Albumin: 4.1 g/dL (ref 3.9–4.9)
Alkaline Phosphatase: 69 IU/L (ref 44–121)
BUN/Creatinine Ratio: 15 (ref 9–23)
BUN: 11 mg/dL (ref 6–20)
Bilirubin Total: 0.3 mg/dL (ref 0.0–1.2)
CO2: 15 mmol/L — ABNORMAL LOW (ref 20–29)
Calcium: 8.9 mg/dL (ref 8.7–10.2)
Chloride: 103 mmol/L (ref 96–106)
Creatinine, Ser: 0.74 mg/dL (ref 0.57–1.00)
Globulin, Total: 2.6 g/dL (ref 1.5–4.5)
Glucose: 96 mg/dL (ref 70–99)
Potassium: 4.4 mmol/L (ref 3.5–5.2)
Sodium: 137 mmol/L (ref 134–144)
Total Protein: 6.7 g/dL (ref 6.0–8.5)
eGFR: 107 mL/min/1.73 (ref >59)

## 2023-08-15 LAB — VITAMIN D 25 HYDROXY (VIT D DEFICIENCY, FRACTURES): Vit D, 25-Hydroxy: 30.2 ng/mL (ref 30.0–100.0)

## 2023-08-15 LAB — IRON,TIBC AND FERRITIN PANEL
Ferritin: 36 ng/mL (ref 15–150)
Iron Saturation: 15 % (ref 15–55)
Iron: 51 ug/dL (ref 27–159)
Total Iron Binding Capacity: 336 ug/dL (ref 250–450)
UIBC: 285 ug/dL (ref 131–425)

## 2023-08-15 LAB — B12 AND FOLATE PANEL
Folate: 14.6 ng/mL (ref >3.0)
Vitamin B-12: 819 pg/mL (ref 232–1245)

## 2023-08-15 LAB — TSH+FREE T4
Free T4: 1.06 ng/dL (ref 0.82–1.77)
TSH: 2.02 u[IU]/mL (ref 0.450–4.500)

## 2023-08-15 NOTE — Progress Notes (Signed)
 Denell,   Serum iron is down from last check. I would start iron supplement.  Thyroid looks good  B12 and folate look great.  Vitamin D  better but still low. Increase by another 2000 units of D3 a day.

## 2023-08-27 ENCOUNTER — Other Ambulatory Visit (HOSPITAL_BASED_OUTPATIENT_CLINIC_OR_DEPARTMENT_OTHER): Payer: Self-pay

## 2023-08-29 ENCOUNTER — Other Ambulatory Visit (HOSPITAL_BASED_OUTPATIENT_CLINIC_OR_DEPARTMENT_OTHER): Payer: Self-pay

## 2023-08-29 ENCOUNTER — Other Ambulatory Visit: Payer: Self-pay

## 2023-09-26 ENCOUNTER — Ambulatory Visit (INDEPENDENT_AMBULATORY_CARE_PROVIDER_SITE_OTHER): Admitting: Physician Assistant

## 2023-09-26 ENCOUNTER — Other Ambulatory Visit (HOSPITAL_BASED_OUTPATIENT_CLINIC_OR_DEPARTMENT_OTHER): Payer: Self-pay

## 2023-09-26 ENCOUNTER — Encounter: Payer: Self-pay | Admitting: Physician Assistant

## 2023-09-26 VITALS — BP 121/68 | HR 60 | Ht 67.0 in | Wt 229.0 lb

## 2023-09-26 DIAGNOSIS — Z6835 Body mass index (BMI) 35.0-35.9, adult: Secondary | ICD-10-CM

## 2023-09-26 DIAGNOSIS — G478 Other sleep disorders: Secondary | ICD-10-CM | POA: Diagnosis not present

## 2023-09-26 DIAGNOSIS — E66812 Obesity, class 2: Secondary | ICD-10-CM | POA: Diagnosis not present

## 2023-09-26 DIAGNOSIS — Z7985 Long-term (current) use of injectable non-insulin antidiabetic drugs: Secondary | ICD-10-CM

## 2023-09-26 DIAGNOSIS — Z0000 Encounter for general adult medical examination without abnormal findings: Secondary | ICD-10-CM

## 2023-09-26 DIAGNOSIS — E785 Hyperlipidemia, unspecified: Secondary | ICD-10-CM

## 2023-09-26 DIAGNOSIS — R0683 Snoring: Secondary | ICD-10-CM | POA: Diagnosis not present

## 2023-09-26 DIAGNOSIS — E1169 Type 2 diabetes mellitus with other specified complication: Secondary | ICD-10-CM | POA: Diagnosis not present

## 2023-09-26 MED ORDER — TIRZEPATIDE 10 MG/0.5ML ~~LOC~~ SOAJ
10.0000 mg | SUBCUTANEOUS | 0 refills | Status: DC
Start: 2023-09-26 — End: 2023-12-12
  Filled 2023-09-26: qty 6, 84d supply, fill #0

## 2023-09-26 NOTE — Patient Instructions (Addendum)
 Will order sleep study  Health Maintenance, Female Adopting a healthy lifestyle and getting preventive care are important in promoting health and wellness. Ask your health care provider about: The right schedule for you to have regular tests and exams. Things you can do on your own to prevent diseases and keep yourself healthy. What should I know about diet, weight, and exercise? Eat a healthy diet  Eat a diet that includes plenty of vegetables, fruits, low-fat dairy products, and lean protein. Do not eat a lot of foods that are high in solid fats, added sugars, or sodium. Maintain a healthy weight Body mass index (BMI) is used to identify weight problems. It estimates body fat based on height and weight. Your health care provider can help determine your BMI and help you achieve or maintain a healthy weight. Get regular exercise Get regular exercise. This is one of the most important things you can do for your health. Most adults should: Exercise for at least 150 minutes each week. The exercise should increase your heart rate and make you sweat (moderate-intensity exercise). Do strengthening exercises at least twice a week. This is in addition to the moderate-intensity exercise. Spend less time sitting. Even light physical activity can be beneficial. Watch cholesterol and blood lipids Have your blood tested for lipids and cholesterol at 36 years of age, then have this test every 5 years. Have your cholesterol levels checked more often if: Your lipid or cholesterol levels are high. You are older than 36 years of age. You are at high risk for heart disease. What should I know about cancer screening? Depending on your health history and family history, you may need to have cancer screening at various ages. This may include screening for: Breast cancer. Cervical cancer. Colorectal cancer. Skin cancer. Lung cancer. What should I know about heart disease, diabetes, and high blood  pressure? Blood pressure and heart disease High blood pressure causes heart disease and increases the risk of stroke. This is more likely to develop in people who have high blood pressure readings or are overweight. Have your blood pressure checked: Every 3-5 years if you are 98-28 years of age. Every year if you are 32 years old or older. Diabetes Have regular diabetes screenings. This checks your fasting blood sugar level. Have the screening done: Once every three years after age 42 if you are at a normal weight and have a low risk for diabetes. More often and at a younger age if you are overweight or have a high risk for diabetes. What should I know about preventing infection? Hepatitis B If you have a higher risk for hepatitis B, you should be screened for this virus. Talk with your health care provider to find out if you are at risk for hepatitis B infection. Hepatitis C Testing is recommended for: Everyone born from [birth year] through 1963. Anyone with known risk factors for hepatitis C. Sexually transmitted infections (STIs) Get screened for STIs, including gonorrhea and chlamydia, if: You are sexually active and are younger than 36 years of age. You are older than 36 years of age and your health care provider tells you that you are at risk for this type of infection. Your sexual activity has changed since you were last screened, and you are at increased risk for chlamydia or gonorrhea. Ask your health care provider if you are at risk. Ask your health care provider about whether you are at high risk for HIV. Your health care provider may recommend a prescription  medicine to help prevent HIV infection. If you choose to take medicine to prevent HIV, you should first get tested for HIV. You should then be tested every 3 months for as long as you are taking the medicine. Pregnancy If you are about to stop having your period (premenopausal) and you may become pregnant, seek counseling before you  get pregnant. Take 400 to 800 micrograms (mcg) of folic acid  every day if you become pregnant. Ask for birth control (contraception) if you want to prevent pregnancy. Osteoporosis and menopause Osteoporosis is a disease in which the bones lose minerals and strength with aging. This can result in bone fractures. If you are 47 years old or older, or if you are at risk for osteoporosis and fractures, ask your health care provider if you should: Be screened for bone loss. Take a calcium  or vitamin D  supplement to lower your risk of fractures. Be given hormone replacement therapy (HRT) to treat symptoms of menopause. Follow these instructions at home: Alcohol use Do not drink alcohol if: Your health care provider tells you not to drink. You are pregnant, may be pregnant, or are planning to become pregnant. If you drink alcohol: Limit how much you have to: 0-1 drink a day. Know how much alcohol is in your drink. In the [place] one drink equals one 12 oz bottle of beer (355 mL), one 5 oz glass of wine (148 mL), or one 1 oz glass of hard liquor (44 mL). Lifestyle Do not use any products that contain nicotine or tobacco. These products include cigarettes, chewing tobacco, and vaping devices, such as e-cigarettes. If you need help quitting, ask your health care provider. Do not use street drugs. Do not share needles. Ask your health care provider for help if you need support or information about quitting drugs. General instructions Schedule regular health, dental, and eye exams. Stay current with your vaccines. Tell your health care provider if: You often feel depressed. You have ever been abused or do not feel safe at home. Summary Adopting a healthy lifestyle and getting preventive care are important in promoting health and wellness. Follow your health care provider's instructions about healthy diet, exercising, and getting tested or screened for diseases. Follow your health care provider's  instructions on monitoring your cholesterol and blood pressure. This information is not intended to replace advice given to you by your health care provider. Make sure you discuss any questions you have with your health care provider. Document Revised: 06/07/2020 Document Reviewed: 06/07/2020 Elsevier Patient Education  2024 [employer].

## 2023-09-27 LAB — LIPID PANEL
Chol/HDL Ratio: 4.7 ratio — ABNORMAL HIGH (ref 0.0–4.4)
Cholesterol, Total: 236 mg/dL — ABNORMAL HIGH (ref 100–199)
HDL: 50 mg/dL (ref >39)
LDL Chol Calc (NIH): 171 mg/dL — ABNORMAL HIGH (ref 0–99)
Triglycerides: 86 mg/dL (ref 0–149)
VLDL Cholesterol Cal: 15 mg/dL (ref 5–40)

## 2023-09-28 ENCOUNTER — Other Ambulatory Visit (HOSPITAL_BASED_OUTPATIENT_CLINIC_OR_DEPARTMENT_OTHER): Payer: Self-pay

## 2023-09-28 ENCOUNTER — Ambulatory Visit: Payer: Self-pay | Admitting: Physician Assistant

## 2023-09-28 ENCOUNTER — Encounter: Payer: Self-pay | Admitting: Physician Assistant

## 2023-09-28 MED ORDER — ATORVASTATIN CALCIUM 20 MG PO TABS
20.0000 mg | ORAL_TABLET | Freq: Every day | ORAL | 3 refills | Status: AC
  Filled 2023-09-28: qty 90, 90d supply, fill #0
  Filled 2023-12-19: qty 90, 90d supply, fill #1

## 2023-09-28 NOTE — Progress Notes (Signed)
 Complete physical exam  Patient: Courtney Bailey   DOB: 03-Jan-1986   38 y.o. Female  MRN: [id number]  Subjective:    Chief Complaint  Patient presents with   Annual Exam    Courtney Bailey is a 38 y.o. female who presents today for a complete physical exam. She reports consuming a general diet. She has start burn body bootcamp and working out daily. She generally feels well. She reports sleeping fairly well. She does have additional problems to discuss today. She does still have a lot of fatigue. She can't seem to lose weight well despite being on mounjaro , exercising daily and eating healthy. She does snore. She wonders about sleep apnea. Her last A1c was do goal. She is tolerating the mounjaro  10mg  well and her appetite is low.    Most recent fall risk assessment:    09/26/2023    4:07 PM  Fall Risk   Falls in the past year? 0  Number falls in past yr: 0  Injury with Fall? 0     Most recent depression screenings:    09/26/2023    4:07 PM 08/14/2023    8:44 AM  PHQ 2/9 Scores  PHQ - 2 Score 2 2  PHQ- 9 Score 2 9    Vision:Within last year and Dental: No current dental problems and Receives regular dental care  Patient Active Problem List   Diagnosis Date Noted   Non-restorative sleep 09/26/2023   Snoring 09/26/2023   Class 1 obesity due to excess calories with serious comorbidity and body mass index (BMI) of 34.0 to 34.9 in adult 02/13/2023   Brain fog 10/25/2022   Memory changes 10/25/2022   Acute right-sided low back pain without sciatica 10/24/2022   Family history of brain tumor 10/24/2022   Migraine with aura and without status migrainosus, not intractable 10/24/2022   Nausea vomiting and diarrhea 07/26/2022   Panic attacks 10/12/2021   Dry skin 12/29/2020   Elevated liver enzymes 12/29/2020   Diabetes mellitus (HCC) 09/29/2020   No energy 09/27/2020   Complicated grief 05/07/2020   Post partum depression 05/07/2020   Urinary frequency 09/19/2019   Iron  deficiency anemia secondary to inadequate dietary iron intake 09/19/2019   Abnormal glucose in pregnancy, antepartum 07/22/2019   Family history of blood clots 04/18/2019   Pain of left calf 04/18/2019   Left lumbar radiculitis 04/01/2019   Inattention 03/05/2019   Generalized anxiety disorder 02/27/2019   Frequent headaches 02/21/2019   Breast mass, right 11/22/2018   Seasonal allergies 04/30/2018   Seasonal allergic rhinitis due to pollen 04/30/2018   Allergic conjunctivitis of both eyes 04/30/2018   Depression, recurrent (HCC) 02/19/2018   Anxiety 02/19/2018   Insomnia 02/19/2018   Subjective hearing change 01/04/2018   ETD (Eustachian tube dysfunction), bilateral 01/04/2018   Elevated fasting glucose 12/18/2017   Dyslipidemia, goal LDL below 70 12/18/2017   Class 2 severe obesity due to excess calories with serious comorbidity and body mass index (BMI) of 35.0 to 35.9 in adult (HCC) 12/17/2017   Seborrheic dermatitis of scalp 12/17/2017   Itchy scalp 12/17/2017   Eyelid dermatitis, allergic/contact, right 12/17/2017   Past Medical History:  Diagnosis Date   Disc degeneration, lumbar    Sleep difficulties    Weight gain    Past Surgical History:  Procedure Laterality Date   COLONOSCOPY     Family History  Problem Relation Age of Onset   Hypothyroidism Sister    Breast cancer Neg Hx  Allergies  Allergen Reactions   Penicillin G Other (See Comments)    Does not know reaction. Occurred in early childhood     Trazodone Other (See Comments)    Nightmares   Escitalopram Oxalate Anxiety    Worsening anxiety symptoms.      Patient Care Team: Ivyonna Hoelzel L, PA-C as PCP - General (Family Medicine)   Outpatient Medications Prior to Visit  Medication Sig   atorvastatin  (LIPITOR) 20 MG tablet Take 1 tablet (20 mg total) by mouth at bedtime.   citalopram  (CELEXA ) 20 MG tablet Take 1 tablet (20 mg total) by mouth daily.   Iron Combinations (IRON COMPLEX PO) Take 65  mg by mouth.   [DISCONTINUED] methocarbamol  (ROBAXIN ) 500 MG tablet Take 1 tablet (500 mg total) by mouth every 8 (eight) hours as needed for muscle spasms.   [DISCONTINUED] tirzepatide  (MOUNJARO ) 10 MG/0.5ML Pen Inject 10 mg into the skin once a week.   No facility-administered medications prior to visit.    Review of Systems  All other systems reviewed and are negative.         Objective:     BP 121/68   Pulse 60   Ht 5' 7 (1.702 m)   Wt 229 lb (103.9 kg)   SpO2 99%   BMI 35.87 kg/m  BP Readings from Last 3 Encounters:  09/26/23 121/68  08/14/23 128/78  05/15/23 110/71   Wt Readings from Last 3 Encounters:  09/26/23 229 lb (103.9 kg)  08/14/23 229 lb (103.9 kg)  05/15/23 229 lb (103.9 kg)      Physical Exam  BP 121/68   Pulse 60   Ht 5' 7 (1.702 m)   Wt 229 lb (103.9 kg)   SpO2 99%   BMI 35.87 kg/m   General Appearance:    Alert, cooperative, obese, no distress, appears stated age  Head:    Normocephalic, without obvious abnormality, atraumatic  Eyes:    PERRL, conjunctiva/corneas clear, EOM's intact, fundi    benign, both eyes  Ears:    Normal TM's and external ear canals, both ears  Nose:   Nares normal, septum midline, mucosa normal, no drainage    or sinus tenderness  Throat:   Lips, mucosa, and tongue normal; teeth and gums normal  Neck:   Supple, symmetrical, trachea midline, no adenopathy;    thyroid :  no enlargement/tenderness/nodules; no carotid   bruit or JVD  Back:     Symmetric, no curvature, ROM normal, no CVA tenderness  Lungs:     Clear to auscultation bilaterally, respirations unlabored  Chest Wall:    No tenderness or deformity   Heart:    Regular rate and rhythm, S1 and S2 normal, no murmur, rub   or gallop     Abdomen:     Soft, non-tender, bowel sounds active all four quadrants,    no masses, no organomegaly        Extremities:   Extremities normal, atraumatic, no cyanosis or edema  Pulses:   2+ and symmetric all extremities   Skin:   Skin color, texture, turgor normal, no rashes or lesions  Lymph nodes:   Cervical, supraclavicular, and axillary nodes normal  Neurologic:   CNII-XII intact, normal strength, sensation and reflexes    throughout       Assessment & Plan:    Routine Health Maintenance and Physical Exam  Immunization History  Administered Date(s) Administered   Influenza Inj Mdck Quad Pf 12/14/2019   Influenza Split 11/09/2016  Influenza, Seasonal, Injecte, Preservative Fre 10/31/2022   Influenza,inj,Quad PF,6+ Mos 11/12/2020   Influenza,inj,quad, With Preservative 12/14/2019   Influenza-Unspecified 11/10/2015, 11/09/2016, 11/22/2018   Tdap 10/31/2019    Health Maintenance  Topic Date Due   OPHTHALMOLOGY EXAM  Never done   Hepatitis B Vaccines 19-59 Average Risk (1 of 3 - 19+ 3-dose series) Never done   HPV VACCINES (1 - 3-dose SCDM series) Never done   Cervical Cancer Screening (HPV/Pap Cotest)  06/18/2022   Diabetic kidney evaluation - Urine ACR  09/25/2023   COVID-19 Vaccine (1) 10/14/2023 (Originally 07/07/1992)   Pneumococcal Vaccine (1 of 2 - PCV) 02/13/2024 (Originally 07/08/2006)   INFLUENZA VACCINE  04/29/2024 (Originally 08/31/2023)   HEMOGLOBIN A1C  02/14/2024   FOOT EXAM  05/14/2024   Diabetic kidney evaluation - eGFR measurement  08/13/2024   DTaP/Tdap/Td (2 - Td or Tdap) 10/30/2029   Hepatitis C Screening  Completed   HIV Screening  Completed   Meningococcal B Vaccine  Aged Out    Discussed health benefits of physical activity, and encouraged her to engage in regular exercise appropriate for her age and condition.  SABRAAgnes was seen today for annual exam.  Diagnoses and all orders for this visit:  Routine physical examination -     Lipid panel  Snoring -     Ambulatory referral to Sleep Studies  Non-restorative sleep -     Ambulatory referral to Sleep Studies  Dyslipidemia, goal LDL below 70  Type 2 diabetes mellitus with other specified complication, without  long-term current use of insulin  (HCC) -     tirzepatide  (MOUNJARO ) 10 MG/0.5ML Pen; Inject 10 mg into the skin once a week.  Class 2 severe obesity due to excess calories with serious comorbidity and body mass index (BMI) of 35.0 to 35.9 in adult [facility]) -     Ambulatory referral to Sleep Studies  .SABRA Discussed 150 minutes of exercise a week.  Encouraged vitamin D  1000 units and Calcium  1300mg  or 4 servings of dairy a day.  Labs UTD except need for lipid panel No falls PHq/GAD stable and to goal, on celexa  Vitals look good Pap done with [facility], need to call for records Declined covid booster Will get flu shot at work  Will order home sleep study to evaluate for sleep apnea due to snoring, obesity, fatigue  Return for keep up with every 3 month DM follow up.     Dallas Scorsone, PA-C

## 2023-09-28 NOTE — Progress Notes (Signed)
 Courtney Bailey,   Your bad cholesterol went way up? Are you taking lipitor?

## 2023-10-05 ENCOUNTER — Encounter: Payer: Self-pay | Admitting: Physician Assistant

## 2023-10-12 ENCOUNTER — Other Ambulatory Visit: Payer: Self-pay | Admitting: Physician Assistant

## 2023-10-12 DIAGNOSIS — G478 Other sleep disorders: Secondary | ICD-10-CM

## 2023-10-12 DIAGNOSIS — R413 Other amnesia: Secondary | ICD-10-CM

## 2023-10-12 DIAGNOSIS — E66811 Obesity, class 1: Secondary | ICD-10-CM

## 2023-10-12 DIAGNOSIS — R4189 Other symptoms and signs involving cognitive functions and awareness: Secondary | ICD-10-CM

## 2023-10-12 DIAGNOSIS — R0683 Snoring: Secondary | ICD-10-CM

## 2023-10-15 ENCOUNTER — Telehealth: Payer: Self-pay

## 2023-10-15 NOTE — Telephone Encounter (Signed)
 Copied from CRM #[id number]. Topic: General - Other >> Oct 12, 2023  1:26 PM Mercer PEDLAR wrote: Reason for CRM: Patient would like a callback to discuss sleep study.

## 2023-10-15 NOTE — Telephone Encounter (Signed)
 Attempted call to patient. Left a voice mail message requesting a return call.

## 2023-10-19 NOTE — Telephone Encounter (Signed)
 Send her mychart message with details.

## 2023-10-19 NOTE — Telephone Encounter (Signed)
 Spoke with representative at [facility] at [facility] Was told that everything was put in correctly  Should reach out to patient in 3 to 6 weeks for scheduling  Patient was informed of this

## 2023-10-31 ENCOUNTER — Ambulatory Visit (INDEPENDENT_AMBULATORY_CARE_PROVIDER_SITE_OTHER)

## 2023-10-31 VITALS — Temp 97.9°F

## 2023-10-31 DIAGNOSIS — Z23 Encounter for immunization: Secondary | ICD-10-CM

## 2023-11-14 ENCOUNTER — Ambulatory Visit: Admitting: Physician Assistant

## 2023-12-12 ENCOUNTER — Other Ambulatory Visit (HOSPITAL_BASED_OUTPATIENT_CLINIC_OR_DEPARTMENT_OTHER): Payer: Self-pay

## 2023-12-12 ENCOUNTER — Encounter: Payer: Self-pay | Admitting: Physician Assistant

## 2023-12-12 ENCOUNTER — Ambulatory Visit (INDEPENDENT_AMBULATORY_CARE_PROVIDER_SITE_OTHER): Admitting: Physician Assistant

## 2023-12-12 VITALS — BP 107/68 | HR 70 | Ht 67.0 in | Wt 227.0 lb

## 2023-12-12 DIAGNOSIS — E1169 Type 2 diabetes mellitus with other specified complication: Secondary | ICD-10-CM

## 2023-12-12 DIAGNOSIS — E785 Hyperlipidemia, unspecified: Secondary | ICD-10-CM

## 2023-12-12 DIAGNOSIS — Z7985 Long-term (current) use of injectable non-insulin antidiabetic drugs: Secondary | ICD-10-CM

## 2023-12-12 DIAGNOSIS — F411 Generalized anxiety disorder: Secondary | ICD-10-CM

## 2023-12-12 DIAGNOSIS — G479 Sleep disorder, unspecified: Secondary | ICD-10-CM

## 2023-12-12 LAB — POCT GLYCOSYLATED HEMOGLOBIN (HGB A1C): Hemoglobin A1C: 5.6 % (ref 4.0–5.6)

## 2023-12-12 MED ORDER — TIRZEPATIDE 10 MG/0.5ML ~~LOC~~ SOAJ
10.0000 mg | SUBCUTANEOUS | 0 refills | Status: AC
  Filled 2023-12-12: qty 6, 84d supply, fill #0

## 2023-12-12 NOTE — Progress Notes (Signed)
 "  Established Patient Office Visit  Subjective   Patient ID: Courtney Bailey, female    DOB: 05/21/1985  Age: 38 y.o. MRN: [id number]  HPI Discussed the use of AI scribe software for clinical note transcription with the patient, who gave verbal consent to proceed.  History of Present Illness Courtney Bailey is a 38 year old female with diabetes and hyperlipidemia who presents for a follow-up visit.  Glycemic control and diabetes management - Blood glucose levels are well-controlled, with a recent reading of 5.4%. - Currently taking Mounjaro  10 mg. - Experiencing lack of appetite, which may be related to Mounjaro . - Exercises five days per week, including burn boot camp with body and athletic conditioning and weights. - Mindful of diet, eating twice daily with a focus on protein and low sugar intake. - Occasional cravings for ice cream, particularly around menstrual periods, but returns to regular eating habits afterward.  Hyperlipidemia - History of elevated cholesterol during a period of nonadherence to atorvastatin . - Resumed atorvastatin  three months ago.  Weight management - Progress with weight loss is perceived as slow despite regular exercise and dietary modifications. - Questions whether Mounjaro  is affecting weight loss trajectory.  Mood and anxiety - Taking citalopram  for mood management. - Mood is stable with current regimen. - Occasional anxiety symptoms. - Previously discontinued citalopram  but feels improved since resuming medication.  Sleep quality - Sleep has improved after switching statin administration to nighttime. - Statin medication induces sleepiness at night. - Wakes feeling rested. - No snoring reported.  Libido - Decreased libido, possibly related to Mounjaro .  Social isolation and psychosocial considerations - Feels isolated at home and desires increased social interaction. - Plans to consider returning to work when daughter starts regular  school next year.    ROS See HPI.    Objective:     BP 107/68   Pulse 70   Ht 5' 7 (1.702 m)   Wt 227 lb (103 kg)   SpO2 99%   BMI 35.55 kg/m  BP Readings from Last 3 Encounters:  12/12/23 107/68  09/26/23 121/68  08/14/23 128/78   Wt Readings from Last 3 Encounters:  12/12/23 227 lb (103 kg)  09/26/23 229 lb (103.9 kg)  08/14/23 229 lb (103.9 kg)   Results for orders placed or performed in visit on 12/12/23  POCT HgB A1C   Collection Time: 12/12/23  7:21 AM  Result Value Ref Range   Hemoglobin A1C 5.6 4.0 - 5.6 %   HbA1c POC (<> result, manual entry)     HbA1c, POC (prediabetic range)     HbA1c, POC (controlled diabetic range)    Lipid panel   Collection Time: 12/12/23  8:25 AM  Result Value Ref Range   Cholesterol, Total 162 100 - 199 mg/dL   Triglycerides 67 0 - 149 mg/dL   HDL 51 >60 mg/dL   VLDL Cholesterol Cal 13 5 - 40 mg/dL   LDL Chol Calc (NIH) 98 0 - 99 mg/dL   Chol/HDL Ratio 3.2 0.0 - 4.4 ratio    .    12/14/2023    6:53 AM 09/26/2023    4:07 PM 08/14/2023    8:44 AM 05/16/2023    6:52 AM  GAD 7 : Generalized Anxiety Score  Nervous, Anxious, on Edge 0 0 1 1  Control/stop worrying 0 1 1 1   Worry too much - different things 1 1 1 1   Trouble relaxing 0 1 1 1   Restless 0 0 1  1  Easily annoyed or irritable 0 0 1 1  Afraid - awful might happen 0 0 1 1  Total GAD 7 Score 1 3 7 7   Anxiety Difficulty Not difficult at all Not difficult at all Not difficult at all Somewhat difficult    ..    12/12/2023    7:21 AM 09/26/2023    4:07 PM 08/14/2023    8:44 AM 05/15/2023    8:35 AM 02/13/2023    8:37 AM  Depression screen PHQ 2/9  Decreased Interest 0 1 2 0 2  Down, Depressed, Hopeless 0 1 0 0 0  PHQ - 2 Score 0 2 2 0 2  Altered sleeping  0 2  1  Tired, decreased energy  0 3  1  Change in appetite  0 0  1  Feeling bad or failure about yourself   0 0  0  Trouble concentrating  0 2  0  Moving slowly or fidgety/restless  0 0  0  Suicidal thoughts   0 0  0  PHQ-9 Score  2  9   5    Difficult doing work/chores  Not difficult at all Not difficult at all  Not difficult at all     Data saved with a previous flowsheet row definition     Physical Exam Constitutional:      Appearance: Normal appearance. She is obese.  HENT:     Head: Normocephalic.  Cardiovascular:     Rate and Rhythm: Normal rate and regular rhythm.     Pulses: Normal pulses.     Heart sounds: Normal heart sounds.  Pulmonary:     Effort: Pulmonary effort is normal.     Breath sounds: Normal breath sounds.  Neurological:     General: No focal deficit present.     Mental Status: She is alert.  Psychiatric:        Mood and Affect: Mood normal.        Assessment & Plan:  .SABRASayana was seen today for medical management of chronic issues.  Diagnoses and all orders for this visit:  Hyperlipidemia associated with type 2 diabetes mellitus (HCC) -     POCT HgB A1C -     Lipid panel -     Amb Ref to Medical Weight Management -     tirzepatide  (MOUNJARO ) 10 MG/0.5ML Pen; Inject 10 mg into the skin once a week.  Dyslipidemia, goal LDL below 70 -     Lipid panel -     Amb Ref to Medical Weight Management  Morbid obesity (HCC) -     Amb Ref to Medical Weight Management  Generalized anxiety disorder  Trouble in sleeping   Assessment & Plan Type 2 diabetes mellitus associated with HLD Diabetes well-controlled with HbA1c of 5.6. Tirzepatide  effective. Decreased appetite and lack of sex drive noted, not typical side effects. Advised against dose increase due to potential gastroparesis. - Continue Tirzepatide  (Mounjaro ) 10 mg subcutaneous once a week.  Dyslipidemia Cholesterol likely elevated due to previous discontinuation of atorvastatin . Currently on atorvastatin  20 mg. - Checked lipid levels today. - Continue atorvastatin  20 mg oral at bedtime.  Class 2 severe obesity due to excess calories with serious comorbidity and BMI 35.0-35.9 BMI 35.0-35.9.  Regular gym attendance. Slow weight loss, possibly due to muscle gain. Emphasized muscle mass and body composition over weight. - Referred to [facility] for nutrition and weight management support. - Encouraged keeping a food diary to track dietary  intake.  Depression and anxiety Managed with Citalopram  20 mg. Reports feeling fine, occasional anxiety present. Normalized anxiety and depression experiences. - Continue Citalopram  (Celexa ) 20 mg oral daily. - sleeping has improved.      Return in about 3 months (around 03/13/2024).    Rosiland Sen, PA-C  "

## 2023-12-13 ENCOUNTER — Telehealth: Payer: Self-pay

## 2023-12-13 ENCOUNTER — Ambulatory Visit: Payer: Self-pay | Admitting: Physician Assistant

## 2023-12-13 LAB — LIPID PANEL
Chol/HDL Ratio: 3.2 ratio (ref 0.0–4.4)
Cholesterol, Total: 162 mg/dL (ref 100–199)
HDL: 51 mg/dL (ref >39)
LDL Chol Calc (NIH): 98 mg/dL (ref 0–99)
Triglycerides: 67 mg/dL (ref 0–149)
VLDL Cholesterol Cal: 13 mg/dL (ref 5–40)

## 2023-12-13 NOTE — Telephone Encounter (Signed)
 Copied from CRM #[id number]. Topic: Clinical - Lab/Test Results >> Dec 13, 2023  3:45 PM Delon DASEN wrote: Reason for CRM: patient returned call about labs, read verbatim, no questions

## 2023-12-13 NOTE — Progress Notes (Signed)
 Jess,   Cholesterol is much better! Stay on lipitor!  Jannel Lynne

## 2023-12-13 NOTE — Telephone Encounter (Signed)
 Per E2C2 note- patient has been informed of lab results and did not have any questions

## 2023-12-14 ENCOUNTER — Encounter: Payer: Self-pay | Admitting: Physician Assistant

## 2023-12-14 DIAGNOSIS — G479 Sleep disorder, unspecified: Secondary | ICD-10-CM | POA: Insufficient documentation

## 2023-12-19 ENCOUNTER — Other Ambulatory Visit (HOSPITAL_BASED_OUTPATIENT_CLINIC_OR_DEPARTMENT_OTHER): Payer: Self-pay

## 2024-03-12 ENCOUNTER — Ambulatory Visit: Admitting: Physician Assistant
